# Patient Record
Sex: Female | Born: 1950 | Race: White | Hispanic: No | Marital: Married | Smoking: Never smoker
Health system: Southern US, Community
[De-identification: ages and names within clinical notes are randomized; demographics above are authoritative.]

## PROBLEM LIST (undated history)

## (undated) DIAGNOSIS — I10 Essential (primary) hypertension: Secondary | ICD-10-CM

## (undated) DIAGNOSIS — F419 Anxiety disorder, unspecified: Secondary | ICD-10-CM

## (undated) DIAGNOSIS — N8501 Benign endometrial hyperplasia: Secondary | ICD-10-CM

## (undated) DIAGNOSIS — C50919 Malignant neoplasm of unspecified site of unspecified female breast: Secondary | ICD-10-CM

## (undated) DIAGNOSIS — E119 Type 2 diabetes mellitus without complications: Secondary | ICD-10-CM

## (undated) DIAGNOSIS — G4730 Sleep apnea, unspecified: Secondary | ICD-10-CM

## (undated) DIAGNOSIS — Z923 Personal history of irradiation: Secondary | ICD-10-CM

## (undated) HISTORY — DX: Type 2 diabetes mellitus without complications: E11.9

## (undated) HISTORY — DX: Anxiety disorder, unspecified: F41.9

## (undated) HISTORY — DX: Malignant neoplasm of unspecified site of unspecified female breast: C50.919

## (undated) HISTORY — DX: Benign endometrial hyperplasia: N85.01

## (undated) HISTORY — PX: COLONOSCOPY: SHX174

---

## 2011-10-16 ENCOUNTER — Other Ambulatory Visit: Payer: Self-pay | Admitting: Obstetrics and Gynecology

## 2011-10-16 DIAGNOSIS — N6002 Solitary cyst of left breast: Secondary | ICD-10-CM

## 2011-10-27 ENCOUNTER — Other Ambulatory Visit: Payer: Self-pay

## 2011-10-30 ENCOUNTER — Other Ambulatory Visit: Payer: Self-pay | Admitting: Obstetrics and Gynecology

## 2011-10-30 ENCOUNTER — Ambulatory Visit
Admission: RE | Admit: 2011-10-30 | Discharge: 2011-10-30 | Disposition: A | Discharge status: Home / Self Care | Payer: BC Managed Care – PPO | Source: Ambulatory Visit | Attending: Obstetrics and Gynecology | Admitting: Obstetrics and Gynecology

## 2011-10-30 ENCOUNTER — Other Ambulatory Visit: Payer: Self-pay

## 2011-10-30 DIAGNOSIS — N6002 Solitary cyst of left breast: Secondary | ICD-10-CM

## 2012-10-04 ENCOUNTER — Other Ambulatory Visit: Payer: Self-pay | Admitting: Obstetrics and Gynecology

## 2012-10-05 ENCOUNTER — Other Ambulatory Visit: Payer: Self-pay | Admitting: *Deleted

## 2012-10-05 DIAGNOSIS — N644 Mastodynia: Secondary | ICD-10-CM

## 2012-10-13 HISTORY — PX: BREAST LUMPECTOMY: SHX2

## 2012-11-01 ENCOUNTER — Other Ambulatory Visit: Payer: Self-pay | Admitting: *Deleted

## 2012-11-01 ENCOUNTER — Ambulatory Visit
Admission: RE | Admit: 2012-11-01 | Discharge: 2012-11-01 | Disposition: A | Discharge status: Home / Self Care | Payer: BC Managed Care – PPO | Source: Ambulatory Visit | Attending: *Deleted | Admitting: *Deleted

## 2012-11-01 DIAGNOSIS — N644 Mastodynia: Secondary | ICD-10-CM

## 2012-11-04 ENCOUNTER — Telehealth: Payer: Self-pay | Admitting: *Deleted

## 2012-11-04 DIAGNOSIS — C50819 Malignant neoplasm of overlapping sites of unspecified female breast: Secondary | ICD-10-CM

## 2012-11-04 NOTE — Telephone Encounter (Signed)
Confirmed [facility] for 11/10/12 at 1200.  Instructions and contact information given.

## 2012-11-10 ENCOUNTER — Ambulatory Visit: Payer: BC Managed Care – PPO | Admitting: Physical Therapy

## 2012-11-10 ENCOUNTER — Ambulatory Visit: Payer: BC Managed Care – PPO | Admitting: Oncology

## 2012-11-10 ENCOUNTER — Other Ambulatory Visit: Payer: BC Managed Care – PPO | Admitting: Lab

## 2012-11-10 ENCOUNTER — Ambulatory Visit (INDEPENDENT_AMBULATORY_CARE_PROVIDER_SITE_OTHER): Payer: BC Managed Care – PPO | Admitting: General Surgery

## 2012-11-10 ENCOUNTER — Ambulatory Visit: Payer: BC Managed Care – PPO

## 2012-11-10 ENCOUNTER — Ambulatory Visit
Admission: RE | Admit: 2012-11-10 | Payer: BC Managed Care – PPO | Source: Ambulatory Visit | Admitting: Radiation Oncology

## 2012-11-12 ENCOUNTER — Telehealth: Payer: Self-pay | Admitting: *Deleted

## 2012-11-12 NOTE — Telephone Encounter (Signed)
Pt called stating she was unable to make the appt d/t the weather.  We have r/s her appt for [facility] on 11/17/12 for PM clinic to start at 1200.  Baltazar Apo confirmed new date and time.

## 2012-11-13 HISTORY — PX: BREAST SURGERY: SHX581

## 2012-11-17 ENCOUNTER — Encounter: Payer: Self-pay | Admitting: Oncology

## 2012-11-17 ENCOUNTER — Ambulatory Visit
Admission: RE | Admit: 2012-11-17 | Discharge: 2012-11-17 | Disposition: A | Discharge status: Home / Self Care | Payer: BC Managed Care – PPO | Source: Ambulatory Visit | Attending: Radiation Oncology | Admitting: Radiation Oncology

## 2012-11-17 ENCOUNTER — Encounter (INDEPENDENT_AMBULATORY_CARE_PROVIDER_SITE_OTHER): Payer: Self-pay | Admitting: Surgery

## 2012-11-17 ENCOUNTER — Other Ambulatory Visit (HOSPITAL_BASED_OUTPATIENT_CLINIC_OR_DEPARTMENT_OTHER): Payer: BC Managed Care – PPO | Admitting: Lab

## 2012-11-17 ENCOUNTER — Ambulatory Visit (HOSPITAL_BASED_OUTPATIENT_CLINIC_OR_DEPARTMENT_OTHER): Payer: BC Managed Care – PPO | Admitting: Oncology

## 2012-11-17 ENCOUNTER — Encounter: Payer: Self-pay | Admitting: *Deleted

## 2012-11-17 ENCOUNTER — Ambulatory Visit: Payer: BC Managed Care – PPO

## 2012-11-17 ENCOUNTER — Ambulatory Visit: Payer: BC Managed Care – PPO | Attending: Surgery | Admitting: Physical Therapy

## 2012-11-17 ENCOUNTER — Ambulatory Visit (HOSPITAL_BASED_OUTPATIENT_CLINIC_OR_DEPARTMENT_OTHER): Payer: BC Managed Care – PPO | Admitting: Surgery

## 2012-11-17 ENCOUNTER — Telehealth: Payer: Self-pay | Admitting: Oncology

## 2012-11-17 VITALS — BP 149/90 | HR 116 | Temp 97.9°F | Resp 20 | Ht 64.5 in | Wt 232.5 lb

## 2012-11-17 DIAGNOSIS — C50819 Malignant neoplasm of overlapping sites of unspecified female breast: Secondary | ICD-10-CM

## 2012-11-17 DIAGNOSIS — C50919 Malignant neoplasm of unspecified site of unspecified female breast: Secondary | ICD-10-CM

## 2012-11-17 DIAGNOSIS — R293 Abnormal posture: Secondary | ICD-10-CM | POA: Insufficient documentation

## 2012-11-17 DIAGNOSIS — Z170 Estrogen receptor positive status [ER+]: Secondary | ICD-10-CM

## 2012-11-17 DIAGNOSIS — M25619 Stiffness of unspecified shoulder, not elsewhere classified: Secondary | ICD-10-CM | POA: Insufficient documentation

## 2012-11-17 DIAGNOSIS — IMO0001 Reserved for inherently not codable concepts without codable children: Secondary | ICD-10-CM | POA: Insufficient documentation

## 2012-11-17 LAB — COMPREHENSIVE METABOLIC PANEL (CC13)
ALT: 40 U/L (ref 0–55)
AST: 32 U/L (ref 5–34)
Alkaline Phosphatase: 74 U/L (ref 40–150)
BUN: 10.4 mg/dL (ref 7.0–26.0)
CO2: 22 mEq/L (ref 22–29)
Calcium: 10 mg/dL (ref 8.4–10.4)
Glucose: 392 mg/dl — ABNORMAL HIGH (ref 70–99)
Potassium: 4 mEq/L (ref 3.5–5.1)
Sodium: 135 mEq/L — ABNORMAL LOW (ref 136–145)
Total Bilirubin: 0.91 mg/dL (ref 0.20–1.20)

## 2012-11-17 LAB — CBC WITH DIFFERENTIAL/PLATELET
Basophils Absolute: 0 10*3/uL (ref 0.0–0.1)
Eosinophils Absolute: 0 10*3/uL (ref 0.0–0.5)
HCT: 44.9 % (ref 34.8–46.6)
HGB: 15.4 g/dL (ref 11.6–15.9)
LYMPH%: 22.9 % (ref 14.0–49.7)
MCV: 94.8 fL (ref 79.5–101.0)
MONO#: 0.6 10*3/uL (ref 0.1–0.9)
MONO%: 7.5 % (ref 0.0–14.0)
NEUT#: 5.7 10*3/uL (ref 1.5–6.5)
Platelets: 248 10*3/uL (ref 145–400)
WBC: 8.3 10*3/uL (ref 3.9–10.3)

## 2012-11-17 NOTE — Progress Notes (Signed)
Checked in new patient. No financial issues. I gave her a [facility] packet. She said she didn't receive one. She was a little nervous- I walked with her out to lobby.

## 2012-11-17 NOTE — Progress Notes (Signed)
[facility] Radiation Oncology NEW PATIENT EVALUATION  Name: Linda Boyer MRN: [id number]  Date:   11/17/2012           DOB: 01-Sep-1951  Status: outpatient   CC:   Shelly Rubenstein, MD , Dr. Samara Deist Greven/Dr. Daleen Squibb, [facility] Department of Rad Onc [phone] # [phone])   REFERRING PHYSICIAN: Shelly Rubenstein, MD   DIAGNOSIS: Stage I (T1, N0, M0) invasive ductal carcinoma of the right breast   HISTORY OF PRESENT ILLNESS:  Linda Boyer is a 62 y.o. female who is today at the [facility]  for the courtesy of Dr. Magnus Ivan for discussion of possible radiation therapy following conservative surgery in the management of her T1 N0 invasive ductal carcinoma of the right breast. At the time of screening mammography at the [facility] she was noted to have a new spiculated mass within the upper right breast, anteriorly third. There were a few associated punctate calcifications. On examination there was a focal area of thickening at the 12:00 position of the right breast, 3 cm from the nipple. On ultrasound she was found have a 1.0 x 1.0 x 1.2 cm hypoechoic mass at 12:00,  3 cm from the nipple. There were no abnormal appearing right axillary lymph nodes. Ultrasound-guided biopsy on 11/01/2012 was diagnostic for invasive ductal carcinoma. This was ER positive at 86% and PR positive at 100% with a Ki-67 proliferation marker of 16%. HER-2/neu was not amplified. Breast MRI was not performed. She is seen today with Dr. Abigail Miyamoto of general surgery and Dr. Dr. Darnelle Catalan of medical oncology.  PREVIOUS RADIATION THERAPY: No   PAST MEDICAL HISTORY:  has a past medical history of Breast cancer and Diabetes mellitus without complication.     PAST SURGICAL HISTORY: No past surgical history on file.   FAMILY HISTORY: Her father died of a heart attack at 41. Her mother died from kidney failure at 71. No known family history of breast cancer.  SOCIAL HISTORY:  reports that she has never smoked.  She does not have any smokeless tobacco history on file. She reports that she does not drink alcohol or use illicit drugs. Married, one child. 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   ALLERGIES: Review of patient's allergies indicates no known allergies.   MEDICATIONS:  Current Outpatient Prescriptions  Medication Sig Dispense Refill  . fish oil-omega-3 fatty acids 1000 MG capsule Take 2 g by mouth daily.      Marland Kitchen glipiZIDE (GLUCOTROL) 10 MG tablet Take 20 mg by mouth 2 (two) times daily before a meal.      . lisinopril (PRINIVIL,ZESTRIL) 20 MG tablet Take 20 mg by mouth daily.      . metFORMIN (GLUCOPHAGE) 500 MG tablet Take 1,000 mg by mouth 2 (two) times daily with a meal.      . Multiple Vitamins-Minerals (MULTIVITAMIN PO) Take by mouth daily.      Marland Kitchen VITAMIN E PO Take by mouth.         REVIEW OF SYSTEMS:  Pertinent items are noted in HPI.    PHYSICAL EXAM: Alert and oriented 62 year old white female appearing her stated age. Wt Readings from Last 3 Encounters:  11/17/12 232 lb 8 oz (105.461 kg)   Temp Readings from Last 3 Encounters:  11/17/12 97.9 F (36.6 C)    BP Readings from Last 3 Encounters:  11/17/12 149/90   Pulse Readings from Last  3 Encounters:  11/17/12 116   Head and neck examination: Grossly unremarkable. Nodes: Without palpable cervical, supraclavicular, or axillary lymphadenopathy. Chest: Lungs clear. Back: Without spinal or CVA tenderness. Heart: Regular in rhythm. Breasts: There is a palpable 2-3 cm hematoma at approximately 11:00 adjacent to the nipple areolar complex. There is surrounding ecchymosis. No other masses are appreciated. Left breast without masses or lesions. Abdomen: Without masses organomegaly. Extremities: Without edema.    LABORATORY DATA:  Lab Results  Component Value Date   WBC 8.3 11/17/2012   HGB 15.4 11/17/2012   HCT 44.9 11/17/2012   MCV 94.8 11/17/2012   PLT 248  11/17/2012   Lab Results  Component Value Date   NA 135* 11/17/2012   K 4.0 11/17/2012   CL 96* 11/17/2012   CO2 22 11/17/2012   Lab Results  Component Value Date   ALT 40 11/17/2012   AST 32 11/17/2012   ALKPHOS 74 11/17/2012   BILITOT 0.91 11/17/2012      IMPRESSION: Clinical stage I (T1, N0, M0) invasive ductal carcinoma of the right breast. Local treatment options include mastectomy versus partial mastectomy followed by radiation therapy. She would also need a sentinel lymph node biopsy. She desires breast preservation. Based on her breast size she is probably not a candidate for hypo-fractionated radiation therapy. She may be a candidate for prone breast radiation. We discussed the potential acute and late toxicities of radiation therapy. [place] in [place]. [place], and I think it would be more convenient for her to be treated at [facility] in  [place] . We can make a referral to Dr. Luana Shu or Dr. Daleen Squibb to see her following her definitive surgery. I understand that Dr. Darnelle Catalan will order Oncotype DX testing to see she would benefit from chemotherapy in addition to adjuvant hormone therapy.   PLAN: As discussed above. She will be represented at the Wednesday morning breast conference. A referral can be made at that time to Dr. Gwendalyn Ege or Dr. Carolin Sicks.   I spent 40 minutes minutes face to face with the patient and more than 50% of that time was spent in counseling and/or coordination of care.

## 2012-11-17 NOTE — Progress Notes (Signed)
Patient ID: Linda Boyer, female   DOB: Nov 17, 1950, 61 y.o.   MRN: [id number]  Chief Complaint  Patient presents with  . Follow-up    right breast cancer    HPI Linda Boyer is a 62 y.o. female.   HPI She is referred by Dr. Ephriam Knuckles in [place]. [place] After recent screening mammography demonstrated a mass in the right breast. She denies nipple discharge. She has had no previous problems with her breasts. She has had no previous surgery on her breast. Family history is negative for breast cancer. She is otherwise without complaints. Past Medical History  Diagnosis Date  . Breast cancer   . Diabetes mellitus without complication     History reviewed. No pertinent past surgical history.  History reviewed. No pertinent family history.  Social History History  Substance Use Topics  . Smoking status: Never Smoker   . Smokeless tobacco: Not on file  . Alcohol Use: No    No Known Allergies  Current Outpatient Prescriptions  Medication Sig Dispense Refill  . fish oil-omega-3 fatty acids 1000 MG capsule Take 2 g by mouth daily.      Marland Kitchen glipiZIDE (GLUCOTROL) 10 MG tablet Take 20 mg by mouth 2 (two) times daily before a meal.      . lisinopril (PRINIVIL,ZESTRIL) 20 MG tablet Take 20 mg by mouth daily.      . metFORMIN (GLUCOPHAGE) 500 MG tablet Take 1,000 mg by mouth 2 (two) times daily with a meal.      . Multiple Vitamins-Minerals (MULTIVITAMIN PO) Take by mouth daily.      Marland Kitchen VITAMIN E PO Take by mouth.        Review of Systems Review of Systems  Constitutional: Negative for fever, chills and unexpected weight change.  HENT: Negative for hearing loss, congestion, sore throat, trouble swallowing and voice change.   Eyes: Negative for visual disturbance.  Respiratory: Negative for cough and wheezing.   Cardiovascular: Negative for chest pain, palpitations and leg swelling.  Gastrointestinal: Negative for nausea, vomiting, abdominal pain, diarrhea, constipation, blood in stool,  abdominal distention and anal bleeding.  Genitourinary: Negative for hematuria, vaginal bleeding and difficulty urinating.  Musculoskeletal: Negative for arthralgias.  Skin: Negative for rash and wound.  Neurological: Negative for seizures, syncope and headaches.  Hematological: Negative for adenopathy. Does not bruise/bleed easily.  Psychiatric/Behavioral: Negative for confusion.    There were no vitals taken for this visit.  Physical Exam Physical Exam  Constitutional: She is oriented to person, place, and time. She appears well-developed and well-nourished. No distress.       obese  HENT:  Head: Normocephalic and atraumatic.  Right Ear: External ear normal.  Left Ear: External ear normal.  Nose: Nose normal.  Mouth/Throat: Oropharynx is clear and moist. No oropharyngeal exudate.  Eyes: Conjunctivae normal are normal. Pupils are equal, round, and reactive to light. Right eye exhibits no discharge. Left eye exhibits no discharge. No scleral icterus.  Neck: Normal range of motion. Neck supple. No tracheal deviation present. No thyromegaly present.  Cardiovascular: Normal rate, regular rhythm, normal heart sounds and intact distal pulses.   No murmur heard. Pulmonary/Chest: Effort normal and breath sounds normal. No respiratory distress. She has no wheezes. She has no rales.  Abdominal: Soft. Bowel sounds are normal. She exhibits no distension. There is no tenderness. There is no rebound.  Musculoskeletal: Normal range of motion. She exhibits no edema and no tenderness.  Lymphadenopathy:    She has no cervical adenopathy.  Neurological: She is alert and oriented to person, place, and time.  Skin: Skin is warm and dry. No rash noted. She is not diaphoretic. No erythema.  Psychiatric: Her behavior is normal. Judgment normal.  Breast:  Moderate amount of hematoma and ecchymosis in right breast.  No other palpable masses +enlarged right axillary lymph node  Data Reviewed The  ultrasound, mammogram, pathology results have been reviewed  Assessment    Invasive right breast cancer    Plan    After discussion in the multidisciplinary conference, lumpectomy and sentinel of the biopsy has been recommended. I discussed breast conservation versus mastectomy with her and she would like to proceed with breast conservation. I discussed the surgical procedure in detail. I discussed the risks of surgery which include but not limited to bleeding, infection, need for further surgery should the margins were noted to be positive, injury to surrounding structures, et Karie Soda. She understands and wishes to proceed. Likelihood of success is good.       Iliana Hutt A 11/17/2012, 2:01 PM

## 2012-11-17 NOTE — Progress Notes (Signed)
ID: Roderick Pee   DOB: [birth year]  MR#: [id number]  WUJ#:[id number]  PCP: Ephriam Knuckles GYN:  SU: Abigail Miyamoto OTHER MD: Chipper Herb   HISTORY OF PRESENT ILLNESS: Ms. Timpone originally presented with discomfort in the lateral aspect of the left breast. She was referred for mammography to the [facility] Center 10/30/2011, and this showed dense breast tissue bilaterally but no suspicious finding in the left breast. There was no palpable abnormality. Ultrasound of the left breast was negative   On repeat diagnostic mammography a year later however, 11/01/2012, a spiculated mass was found in the upper right breast. There were a few associated punctate calcifications. An area of focal thickening was palpable and ultrasound confirmed a 1.2 cm irregular hypoechoic mass at the 12:00 position of the right breast 3 cm from the nipple. There was no abnormal adenopathy. The rest of the right breast and the left breast were unremarkable.  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 The MIB-1-1 was 16%. There was no HER-2 amplification.  The patient's subsequent history is as detailed below  INTERVAL HISTORY: Ms. Caban is seen in the multidisciplinary breast cancer clinic 11/17/2012. Her case was discussed the morning conference that same day.  REVIEW OF SYSTEMS: She still has some discomfort associated with her left premises not right) breast. She has a sore in her left pinna she wanted me to look at. She has to "bumpy places" 1 in the right lower quadrant of her abdomen and the other one in the deltoid area of her right arm, which she tells me have been present for years and have not changed. Otherwise a detailed review of systems today was noncontributory. Note that she does not exercise regularly.   PAST MEDICAL HISTORY: Past Medical History  Diagnosis Date  . Breast cancer    . Diabetes mellitus without complication    hypertension  PAST SURGICAL HISTORY: No past surgical history on file.  FAMILY HISTORY No family history on file. The patient's father died from a myocardial infarction at age 18. The patient's mother died from renal failure at age 7. The patient had 2 half brothers. Both died from lung cancer in the setting of tobacco abuse. The patient has 4 sisters. There is no history of breast or ovarian cancer in the family.  GYNECOLOGIC HISTORY: Menarche age 26, first live birth age 12, she is GX P1. Menopause 2009. She never took hormone replacement.  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 Her husband Rosanne Ashing is a Naval architect, and she makes called to arrange for his loads. Their son Barbara Cower lives in [place]. area where he works as a Curator. The patient has 1 grandchild. She is not a Advice worker.  ADVANCED DIRECTIVES: Not in place  HEALTH MAINTENANCE: History  Substance Use Topics  . Smoking status: Never Smoker   . Smokeless tobacco: Not on file  . Alcohol Use: No     Colonoscopy: 2013  PAP: 2012  Bone density: 2011  Lipid panel:  No Known Allergies  Current Outpatient Prescriptions  Medication Sig Dispense Refill  . fish oil-omega-3 fatty acids 1000 MG capsule Take 2 g by mouth daily.      Marland Kitchen glipiZIDE (GLUCOTROL) 10 MG tablet Take 20 mg by mouth 2 (two) times daily before a meal.      . lisinopril (PRINIVIL,ZESTRIL) 20 MG  tablet Take 20 mg by mouth daily.      . metFORMIN (GLUCOPHAGE) 500 MG tablet Take 1,000 mg by mouth 2 (two) times daily with a meal.      . Multiple Vitamins-Minerals (MULTIVITAMIN PO) Take by mouth daily.      Marland Kitchen VITAMIN E PO Take by mouth.        OBJECTIVE: Middle-aged white woman who appears anxious Filed Vitals:   11/17/12 1236  BP: 149/90  Pulse: 116  Temp: 97.9 F (36.6 C)  Resp: 20     Body mass index is 39.29 kg/(m^2).    ECOG  FS:  Sclerae unicteric Oropharynx clear No cervical or supraclavicular adenopathy Lungs no rales or rhonchi Heart regular rate and rhythm Abd benign MSK mild kyphosis and scoliosis but no focal spinal tenderness Neuro: nonfocal, well oriented, appropriate affect Breasts: The right breast is status post recent biopsy. There is significant ecchymosis and the palpable lump I am feeling superiorly and laterally is likely to be seroma or hematoma and not tumor. There is no nipple inversion. The right axilla is benign. The left breast is unremarkable Skin: She has a small sore in her left pinna it is approximately 0.5 cm x 0.25 cm, with a scab in place. This appears to be a healing sore. She has a slight bump in her right lower quadrant, approximately 2 inches from the umbilicus, which is likely to be a 1 cm lipoma. She has a similar lipoma on the underside of her right upper extremity  LAB RESULTS: Lab Results  Component Value Date   WBC 8.3 11/17/2012   NEUTROABS 5.7 11/17/2012   HGB 15.4 11/17/2012   HCT 44.9 11/17/2012   MCV 94.8 11/17/2012   PLT 248 11/17/2012      Chemistry      Component Value Date/Time   NA 135* 11/17/2012 1223   K 4.0 11/17/2012 1223   CL 96* 11/17/2012 1223   CO2 22 11/17/2012 1223   BUN 10.4 11/17/2012 1223   CREATININE 1.1 11/17/2012 1223      Component Value Date/Time   CALCIUM 10.0 11/17/2012 1223   ALKPHOS 74 11/17/2012 1223   AST 32 11/17/2012 1223   ALT 40 11/17/2012 1223   BILITOT 0.91 11/17/2012 1223       No results found for this basename: LABCA2    No components found with this basename: LABCA125    No results found for this basename: INR:1;PROTIME:1 in the last 168 hours  Urinalysis No results found for this basename: colorurine, appearanceur, labspec, phurine, glucoseu, hgbur, bilirubinur, ketonesur, proteinur, urobilinogen, nitrite, leukocytesur    STUDIES: US Breast Bilateral  11/01/2012  *RADIOLOGY REPORT*  Clinical Data:  62 year old female for annual  bilateral mammograms with focal tenderness in the outer left breast.  DIGITAL DIAGNOSTIC BILATERAL MAMMOGRAM WITH CAD AND BILATERAL BREAST ULTRASOUND:  Comparison:  Left mammograms dated 10/31/2011 [facility] and left mammograms dated 08/29/2011 from [facility] in [place].  Findings:  ACR Breast Density Category 2: There is a scattered fibroglandular pattern.  A spiculated mass within the upper right breast, anterior third, is identified with a few associated punctate calcifications. There is no evidence of suspicious mass, distortion or worrisome calcifications within the left breast.  Mammographic images were processed with CAD.  On physical exam, a focal area of thickening is identified at the 12 o'clock position of the right breast 3 cm from the nipple. No palpable abnormalities are identified within the entire outer left  breast.  Ultrasound is performed, showing a 1 x 1 x 1.2 cm irregular hypoechoic mass with shadowing at the 12 o'clock position of the right breast 3 cm from the nipple.  No enlarged or abnormal- appearing right axillary lymph nodes are identified. There is no evidence of solid or cystic mass, distortion, or abnormal areas of shadowing within the outer left breast, in the area of patient's tenderness.  IMPRESSION: 1 x 1 x 1.2 cm irregular mass in the upper right breast - highly suspicious for neoplasm.  No enlarged or abnormal appearing right axillary lymph nodes identified.  No mammographic, palpable or sonographic abnormality within the outer left breast, in the area of the patient's pain.  BI-RADS CATEGORY 5:  Highly suggestive of malignancy - appropriate action should be taken.  RECOMMENDATION: Tissue sampling of the right breast mass.  This was discussed with the patient and she desires ultrasound-guided right breast biopsy, which will be performed today but dictated in a separate report.  I discussed these findings with Dr. Sibyl Parr on 11/01/2012.  I discussed the  findings and recommendations with the patient and her questions answered. A written report was given to the patient.   Original Report Authenticated By: Harmon Pier, M.D.    Mm Digital Diagnostic Bilat  11/01/2012  *RADIOLOGY REPORT*  Clinical Data:  62 year old female for annual bilateral mammograms with focal tenderness in the outer left breast.  DIGITAL DIAGNOSTIC BILATERAL MAMMOGRAM WITH CAD AND BILATERAL BREAST ULTRASOUND:  Comparison:  Left mammograms dated 10/31/2011 from [facility] and left mammograms dated 08/29/2011 from [facility] in [place].  Findings:  ACR Breast Density Category 2: There is a scattered fibroglandular pattern.  A spiculated mass within the upper right breast, anterior third, is identified with a few associated punctate calcifications. There is no evidence of suspicious mass, distortion or worrisome calcifications within the left breast.  Mammographic images were processed with CAD.  On physical exam, a focal area of thickening is identified at the 12 o'clock position of the right breast 3 cm from the nipple. No palpable abnormalities are identified within the entire outer left breast.  Ultrasound is performed, showing a 1 x 1 x 1.2 cm irregular hypoechoic mass with shadowing at the 12 o'clock position of the right breast 3 cm from the nipple.  No enlarged or abnormal- appearing right axillary lymph nodes are identified. There is no evidence of solid or cystic mass, distortion, or abnormal areas of shadowing within the outer left breast, in the area of patient's tenderness.  IMPRESSION: 1 x 1 x 1.2 cm irregular mass in the upper right breast - highly suspicious for neoplasm.  No enlarged or abnormal appearing right axillary lymph nodes identified.  No mammographic, palpable or sonographic abnormality within the outer left breast, in the area of the patient's pain.  BI-RADS CATEGORY 5:  Highly suggestive of malignancy - appropriate action should be taken.   RECOMMENDATION: Tissue sampling of the right breast mass.  This was discussed with the patient and she desires ultrasound-guided right breast biopsy, which will be performed today but dictated in a separate report.  I discussed these findings with Dr. Sibyl Parr on 11/01/2012.  I discussed the findings and recommendations with the patient and her questions answered. A written report was given to the patient.   Original Report Authenticated By: Harmon Pier, M.D.    US Breast Bx W Loc Dev 1st Lesion Img Bx Spec US Guide  11/02/2012  **ADDENDUM** CREATED: 11/02/2012 15:43:03  The  pathology associated with the right breast ultrasound guided core biopsy demonstrated grade 2 invasive ductal carcinoma.  This is concordant with imaging findings.  I discussed the findings with the patient by telephone and answered her questions.  The patient states that her biopsy site is clean and dry without significant hematoma formation or tenderness.  Post biopsy wound care instructions were reviewed with the patient.  Following discussion with the patient, she would like to be seen at the breast cancer multidisciplinary clinic at the [facility].  This will be arranged for 11/10/2012.  The patient was encouraged to call the Breast Center for additional questions or concerns.  **END ADDENDUM** SIGNED BY: Rolla Plate, M.D.   11/02/2012  *RADIOLOGY REPORT*  Clinical Data:  62 year old female with suspicious right breast mass - for tissue sampling.  ULTRASOUND GUIDED VACUUM ASSISTED CORE BIOPSY OF THE RIGHT BREAST  Comparison: Previous exams.  I met with the patient and we discussed the procedure of ultrasound- guided biopsy, including benefits and alternatives.  We discussed the high likelihood of a successful procedure. We discussed the risks of the procedure including infection, bleeding, tissue injury, clip migration, and inadequate sampling.  Informed written consent was given.  Using sterile technique, 2% lidocaine  ultrasound guidance and a 12 gauge vacuum assisted needle biopsy was performed of the 1 x 1.2 cm irregular hypoechoic mass at the 12 o'clock position of the right breast 3 cm from the nipple using a lateral approach.  At the conclusion of the procedure, a ribbon shaped tissue marker clip was deployed into the biopsy cavity.  Post biopsy mammogram demonstrates the ribbon shaped biopsy clip in satisfactory position. Mild to moderate postbiopsy hemorrhage is noted.  IMPRESSION: Ultrasound-guided biopsy of right breast mass with satisfactory clip placement. Mild to moderate postbiopsy hemorrhage.  The patient was given detailed care instructions and encouraged to contact me if she had any problems with the biopsy site.  Pathology will be followed, and the patient will be contacted to give her these results and to check on her biopsy site.   Original Report Authenticated By: Harmon Pier, M.D.     ASSESSMENT: 62 y.o. [place], [place] woman status post right breast biopsy 11/01/2012 for a clinical T1c N0, stage IA invasive ductal carcinoma, grade 2, estrogen receptor 86% and progesterone receptor 100% positive, with an MIB-1 of 16% and no HER-2 amplification   PLAN: We spent the better part of her hour-long visit today discussing breast cancer in general, the treatment options, and her home situation. She understands her prognosis in general is good. My feeling is she will not likely benefit from chemotherapy, but we will send an Oncotype DX from her definitive surgery to help [place] make that decision. We did discuss the difference between lumpectomy and mastectomy, and also the need for radiation after lumpectomy. She understands that whether or not she receives chemotherapy, she will be on anti-estrogens for 5 years of once she completes her local treatment.  I believe she was quite reassured after our talk, and understands I do not feel this tumor will eventually take her life, and that she has a very good chance of cure.  She will see me approximately 3 weeks postop, by which time we will have the Oncotype results, and we will make a definitive decision regarding chemotherapy at that time.   MAGRINAT,GUSTAV C    11/17/2012

## 2012-11-17 NOTE — Telephone Encounter (Signed)
gv pt appt schedule for March. °

## 2012-11-20 ENCOUNTER — Encounter: Payer: Self-pay | Admitting: *Deleted

## 2012-11-20 NOTE — Progress Notes (Signed)
Mailed after appt letter to pt. 

## 2012-11-22 ENCOUNTER — Telehealth: Payer: Self-pay | Admitting: *Deleted

## 2012-11-22 ENCOUNTER — Telehealth: Payer: Self-pay | Admitting: Oncology

## 2012-11-22 ENCOUNTER — Other Ambulatory Visit: Payer: Self-pay | Admitting: Oncology

## 2012-11-22 ENCOUNTER — Encounter: Payer: Self-pay | Admitting: *Deleted

## 2012-11-22 NOTE — Telephone Encounter (Signed)
Faxed pt medical records to [employer] 

## 2012-11-22 NOTE — Telephone Encounter (Signed)
Gave pt appt date and time of 11/29/12 at 1:45 for Rad Onc at [place].

## 2012-11-22 NOTE — Telephone Encounter (Signed)
Spoke to pt concerning [facility] from 11/17/12.  Pt denies questions or concerns regarding dx or treatment care plan.  Encourage pt to call with needs.  Received verbal understanding.  Confirmed surgery date.  Informed pt that referral to [facility] for radiation would be made.  Contact information given.

## 2012-11-25 ENCOUNTER — Telehealth: Payer: Self-pay | Admitting: Oncology

## 2012-11-25 NOTE — Telephone Encounter (Signed)
S/w the pt and she is aware of her r/s 12/31/2012 appt to 01/07/2013 due to a change in the md's schedule.

## 2012-12-01 ENCOUNTER — Encounter (HOSPITAL_COMMUNITY): Payer: Self-pay

## 2012-12-03 ENCOUNTER — Ambulatory Visit (HOSPITAL_COMMUNITY)
Admission: RE | Admit: 2012-12-03 | Discharge: 2012-12-03 | Disposition: A | Discharge status: Home / Self Care | Payer: BC Managed Care – PPO | Source: Ambulatory Visit | Attending: Anesthesiology | Admitting: Anesthesiology

## 2012-12-03 ENCOUNTER — Encounter (HOSPITAL_COMMUNITY): Payer: Self-pay

## 2012-12-03 ENCOUNTER — Encounter (HOSPITAL_COMMUNITY)
Admission: RE | Admit: 2012-12-03 | Discharge: 2012-12-03 | Disposition: A | Discharge status: Home / Self Care | Payer: BC Managed Care – PPO | Source: Ambulatory Visit | Attending: Surgery | Admitting: Surgery

## 2012-12-03 VITALS — BP 110/77 | HR 114 | Temp 98.7°F | Resp 20 | Ht 65.0 in | Wt 230.8 lb

## 2012-12-03 DIAGNOSIS — C50919 Malignant neoplasm of unspecified site of unspecified female breast: Secondary | ICD-10-CM

## 2012-12-03 DIAGNOSIS — I4510 Unspecified right bundle-branch block: Secondary | ICD-10-CM | POA: Insufficient documentation

## 2012-12-03 DIAGNOSIS — Z01812 Encounter for preprocedural laboratory examination: Secondary | ICD-10-CM | POA: Insufficient documentation

## 2012-12-03 DIAGNOSIS — Z01818 Encounter for other preprocedural examination: Secondary | ICD-10-CM | POA: Insufficient documentation

## 2012-12-03 DIAGNOSIS — R9431 Abnormal electrocardiogram [ECG] [EKG]: Secondary | ICD-10-CM | POA: Insufficient documentation

## 2012-12-03 DIAGNOSIS — R928 Other abnormal and inconclusive findings on diagnostic imaging of breast: Secondary | ICD-10-CM | POA: Insufficient documentation

## 2012-12-03 DIAGNOSIS — Z01810 Encounter for preprocedural cardiovascular examination: Secondary | ICD-10-CM | POA: Insufficient documentation

## 2012-12-03 HISTORY — DX: Essential (primary) hypertension: I10

## 2012-12-03 HISTORY — DX: Sleep apnea, unspecified: G47.30

## 2012-12-03 LAB — CBC
HCT: 45.4 % (ref 36.0–46.0)
Platelets: 168 10*3/uL (ref 150–400)
RDW: 12.4 % (ref 11.5–15.5)
WBC: 7.9 10*3/uL (ref 4.0–10.5)

## 2012-12-03 LAB — BASIC METABOLIC PANEL
Calcium: 10.8 mg/dL — ABNORMAL HIGH (ref 8.4–10.5)
Chloride: 93 mEq/L — ABNORMAL LOW (ref 96–112)
Creatinine, Ser: 0.65 mg/dL (ref 0.50–1.10)
GFR calc Af Amer: >90 mL/min (ref >90)

## 2012-12-03 LAB — SURGICAL PCR SCREEN
MRSA, PCR: NEGATIVE
Staphylococcus aureus: NEGATIVE

## 2012-12-03 NOTE — Progress Notes (Signed)
Contacted patients primary's office, Dr. Sibyl Parr, spoke with Talbert Forest, office does not have EKG on patient.

## 2012-12-03 NOTE — Pre-Procedure Instructions (Signed)
DONNAMARIA SHANDS  12/03/2012   Your procedure is scheduled on:  Friday December 10, 2012  Report to [facility] at 7:00 AM.  Call this number if you have problems the morning of surgery: [phone]   Remember:   Do not eat food or drink liquids after midnight.   Take these medicines the morning of surgery with A SIP OF WATER: DISCONTINUE ASPIRIN, COUMADIN, PLAVIX, EFFIENT, AND HERBAL MEDICATIONS   Do not wear jewelry, make-up or nail polish.  Do not wear lotions, powders, or perfumes..  Do not shave 48 hours prior to surgery.  Do not bring valuables to the hospital.  Contacts, dentures or bridgework may not be worn into surgery.  Leave suitcase in the car. After surgery it may be brought to your room.  For patients admitted to the hospital, checkout time is 11:00 AM the day of  discharge.   Patients discharged the day of surgery will not be allowed to drive  home.  Name and phone number of your driver: family / friend  Special Instructions: Shower using CHG 2 nights before surgery and the night before surgery.  If you shower the day of surgery use CHG.  Use special wash - you have one bottle of CHG for all showers.  You should use approximately 1/3 of the bottle for each shower.   Please read over the following fact sheets that you were given: Pain Booklet, Coughing and Deep Breathing, MRSA Information and Surgical Site Infection Prevention

## 2012-12-06 NOTE — Progress Notes (Addendum)
Anesthesia chart review: Patient is a 62 year old female scheduled for needle localized right breast lumpectomy and right sentinel lymph node biopsy for right breast cancer by Dr. Magnus Ivan on 12/10/2012. History includes obesity, nonsmoker, diabetes mellitus type 2, hypertension, obstructive sleep apnea without current CPAP use, breast cancer.  PCP is Dr. Ephriam Knuckles. Oncologist is Dr. Darnelle Catalan.  EKG on 12/03/12 showed NSR, right BBB, LAD.  There are no comparison EKGs in [facility] or at Dr. Esperanza Sheets office. No CV symptoms were documented at her PAT visit and preoperative visit with Dr. Magnus Ivan.  CXR on 12/03/12 showed borderline cardiomegaly, lungs clear, patchy aortic calcifications, spurring in the mid and lower thoracic spine without disc narrowing.  Preoperative labs noted.  Non-fasting glucose was 299. These results were reviewed in Epic by Dr. Magnus Ivan.  She will get a CBG on arrival to re-evaluate her glucose level.    I called to speak with her today to clinically correlate EKG and inquire about glucose control, but it was not a good time for her.  She told me she would call me back at a more convenient time.  Otherwise, she will be evaluated by her assigned anesthesiologist on the day of surgery.  Would anticipate that if she has not had any worrisome CV symptoms and glucose is reasonable then she could proceed as planned.  Shonna Chock, PA-C 12/06/12 1420  Addendum: 12/07/12 1025 I spoke with patient this morning.  She reports fasting glucose is usually ~ 200 (occasionally up to 250).  She denies chest pain, SOB, edema.  She does not really do regular aerobic exercise, but can go up a flight of stairs without difficulty and does so at least 20X/day to access her office desk.  Of note, she did mention that she has been prescribed Ativan (she believes 0.5mg ) TID PRN anxiety and may need to take one on the day of surgery.  I told her she is allowed to take her anxiety medication as prescribed  if she has done well with it in the past.  She realizes that she will need to be coherent/appropriate to talk with her surgeon and anesthesiologist on the day of surgery for consent purposes, that any medication should only be taken with a sip of H20, that she should not drive afterwards, and that she needs to tell staff on arrival which med/dose and time taken.

## 2012-12-09 MED ORDER — CEFAZOLIN SODIUM-DEXTROSE 2-3 GM-% IV SOLR
2.0000 g | INTRAVENOUS | Status: AC
  Administered 2012-12-10: 2 g via INTRAVENOUS
  Filled 2012-12-09: qty 50

## 2012-12-09 NOTE — H&P (Signed)
Patient ID: Linda Boyer, female DOB: 1951/04/02, 62 y.o. MRN: [id number]  Chief Complaint   Patient presents with   .  Follow-up     right breast cancer   HPI  Linda Boyer is a 62 y.o. female.  HPI  She is referred by Dr. Ephriam Knuckles in [place]. [place] After recent screening mammography demonstrated a mass in the right breast. She denies nipple discharge. She has had no previous problems with her breasts. She has had no previous surgery on her breast. Family history is negative for breast cancer. She is otherwise without complaints.  Past Medical History   Diagnosis  Date   .  Breast cancer    .  Diabetes mellitus without complication    History reviewed. No pertinent past surgical history.  History reviewed. No pertinent family history.  Social History  History   Substance Use Topics   .  Smoking status:  Never Smoker   .  Smokeless tobacco:  Not on file   .  Alcohol Use:  No   No Known Allergies  Current Outpatient Prescriptions   Medication  Sig  Dispense  Refill   .  fish oil-omega-3 fatty acids 1000 MG capsule  Take 2 g by mouth daily.     Marland Kitchen  glipiZIDE (GLUCOTROL) 10 MG tablet  Take 20 mg by mouth 2 (two) times daily before a meal.     .  lisinopril (PRINIVIL,ZESTRIL) 20 MG tablet  Take 20 mg by mouth daily.     .  metFORMIN (GLUCOPHAGE) 500 MG tablet  Take 1,000 mg by mouth 2 (two) times daily with a meal.     .  Multiple Vitamins-Minerals (MULTIVITAMIN PO)  Take by mouth daily.     Marland Kitchen  VITAMIN E PO  Take by mouth.     Review of Systems  Review of Systems  Constitutional: Negative for fever, chills and unexpected weight change.  HENT: Negative for hearing loss, congestion, sore throat, trouble swallowing and voice change.  Eyes: Negative for visual disturbance.  Respiratory: Negative for cough and wheezing.  Cardiovascular: Negative for chest pain, palpitations and leg swelling.  Gastrointestinal: Negative for nausea, vomiting, abdominal pain, diarrhea, constipation, blood in  stool, abdominal distention and anal bleeding.  Genitourinary: Negative for hematuria, vaginal bleeding and difficulty urinating.  Musculoskeletal: Negative for arthralgias.  Skin: Negative for rash and wound.  Neurological: Negative for seizures, syncope and headaches.  Hematological: Negative for adenopathy. Does not bruise/bleed easily.  Psychiatric/Behavioral: Negative for confusion.  There were no vitals taken for this visit.  Physical Exam  Physical Exam  Constitutional: She is oriented to person, place, and time. She appears well-developed and well-nourished. No distress.  obese  HENT:  Head: Normocephalic and atraumatic.  Right Ear: External ear normal.  Left Ear: External ear normal.  Nose: Nose normal.  Mouth/Throat: Oropharynx is clear and moist. No oropharyngeal exudate.  Eyes: Conjunctivae normal are normal. Pupils are equal, round, and reactive to light. Right eye exhibits no discharge. Left eye exhibits no discharge. No scleral icterus.  Neck: Normal range of motion. Neck supple. No tracheal deviation present. No thyromegaly present.  Cardiovascular: Normal rate, regular rhythm, normal heart sounds and intact distal pulses.  No murmur heard.  Pulmonary/Chest: Effort normal and breath sounds normal. No respiratory distress. She has no wheezes. She has no rales.  Abdominal: Soft. Bowel sounds are normal. She exhibits no distension. There is no tenderness. There is no rebound.  Musculoskeletal: Normal range of motion. She  exhibits no edema and no tenderness.  Lymphadenopathy:  She has no cervical adenopathy.  Neurological: She is alert and oriented to person, place, and time.  Skin: Skin is warm and dry. No rash noted. She is not diaphoretic. No erythema.  Psychiatric: Her behavior is normal. Judgment normal.  Breast: Moderate amount of hematoma and ecchymosis in right breast. No other palpable masses  +enlarged right axillary lymph node   Data Reviewed  The ultrasound,  mammogram, pathology results have been reviewed   Assessment  Invasive right breast cancer   Plan  After discussion in the multidisciplinary conference, lumpectomy and sentinel of the biopsy has been recommended. I discussed breast conservation versus mastectomy with her and she would like to proceed with breast conservation. I discussed the surgical procedure in detail. I discussed the risks of surgery which include but not limited to bleeding, infection, need for further surgery should the margins were noted to be positive, injury to surrounding structures, et Karie Soda. She understands and wishes to proceed. Likelihood of success is good.

## 2012-12-10 ENCOUNTER — Ambulatory Visit (HOSPITAL_COMMUNITY)
Admission: RE | Admit: 2012-12-10 | Discharge: 2012-12-10 | Disposition: A | Discharge status: Home / Self Care | Payer: BC Managed Care – PPO | Source: Ambulatory Visit | Attending: Surgery | Admitting: Surgery

## 2012-12-10 ENCOUNTER — Ambulatory Visit
Admission: RE | Admit: 2012-12-10 | Discharge: 2012-12-10 | Disposition: A | Discharge status: Home / Self Care | Payer: BC Managed Care – PPO | Source: Ambulatory Visit | Attending: Surgery | Admitting: Surgery

## 2012-12-10 ENCOUNTER — Encounter (HOSPITAL_COMMUNITY): Payer: Self-pay | Admitting: Vascular Surgery

## 2012-12-10 ENCOUNTER — Ambulatory Visit (HOSPITAL_COMMUNITY): Payer: BC Managed Care – PPO | Admitting: Certified Registered Nurse Anesthetist

## 2012-12-10 ENCOUNTER — Other Ambulatory Visit (INDEPENDENT_AMBULATORY_CARE_PROVIDER_SITE_OTHER): Payer: Self-pay | Admitting: Surgery

## 2012-12-10 ENCOUNTER — Encounter (HOSPITAL_COMMUNITY)
Admission: RE | Disposition: A | Discharge status: Home / Self Care | Payer: Self-pay | Source: Ambulatory Visit | Attending: Surgery

## 2012-12-10 ENCOUNTER — Encounter (HOSPITAL_COMMUNITY): Payer: Self-pay | Admitting: *Deleted

## 2012-12-10 DIAGNOSIS — C50919 Malignant neoplasm of unspecified site of unspecified female breast: Secondary | ICD-10-CM

## 2012-12-10 DIAGNOSIS — D0590 Unspecified type of carcinoma in situ of unspecified breast: Secondary | ICD-10-CM

## 2012-12-10 DIAGNOSIS — E119 Type 2 diabetes mellitus without complications: Secondary | ICD-10-CM | POA: Insufficient documentation

## 2012-12-10 HISTORY — PX: BREAST LUMPECTOMY WITH NEEDLE LOCALIZATION AND AXILLARY SENTINEL LYMPH NODE BX: SHX5760

## 2012-12-10 LAB — GLUCOSE, CAPILLARY
Glucose-Capillary: 291 mg/dL — ABNORMAL HIGH (ref 70–99)
Glucose-Capillary: 260 mg/dL — ABNORMAL HIGH (ref 70–99)

## 2012-12-10 SURGERY — BREAST LUMPECTOMY WITH NEEDLE LOCALIZATION AND AXILLARY SENTINEL LYMPH NODE BX
Anesthesia: General | Site: Breast | Laterality: Right | Wound class: Clean

## 2012-12-10 MED ORDER — 0.9 % SODIUM CHLORIDE (POUR BTL) OPTIME
TOPICAL | Status: DC | PRN
  Administered 2012-12-10: 1000 mL

## 2012-12-10 MED ORDER — METOCLOPRAMIDE HCL 5 MG/ML IJ SOLN
INTRAMUSCULAR | Status: DC | PRN
  Administered 2012-12-10: 5 mg via INTRAVENOUS

## 2012-12-10 MED ORDER — SODIUM CHLORIDE 0.9 % IJ SOLN
INTRAMUSCULAR | Status: DC | PRN
  Administered 2012-12-10: 11:00:00

## 2012-12-10 MED ORDER — ONDANSETRON HCL 4 MG/2ML IJ SOLN
INTRAMUSCULAR | Status: DC | PRN
  Administered 2012-12-10 (×2): 4 mg via INTRAVENOUS

## 2012-12-10 MED ORDER — OXYCODONE HCL 5 MG/5ML PO SOLN: 5.0000 mg | Freq: Once | ORAL | Status: DC | PRN

## 2012-12-10 MED ORDER — LIDOCAINE HCL (CARDIAC) 20 MG/ML IV SOLN
INTRAVENOUS | Status: DC | PRN
  Administered 2012-12-10: 60 mg via INTRAVENOUS

## 2012-12-10 MED ORDER — TECHNETIUM TC 99M SULFUR COLLOID FILTERED
1.0000 | Freq: Once | INTRAVENOUS | Status: AC | PRN
  Administered 2012-12-10: 1 via INTRADERMAL

## 2012-12-10 MED ORDER — LACTATED RINGERS IV SOLN
INTRAVENOUS | Status: DC | PRN
  Administered 2012-12-10: 10:00:00 via INTRAVENOUS

## 2012-12-10 MED ORDER — HYDROMORPHONE HCL PF 1 MG/ML IJ SOLN
INTRAMUSCULAR | Status: AC
  Filled 2012-12-10: qty 1

## 2012-12-10 MED ORDER — PROPOFOL 10 MG/ML IV BOLUS
INTRAVENOUS | Status: DC | PRN
  Administered 2012-12-10: 200 mg via INTRAVENOUS
  Administered 2012-12-10: 50 mg via INTRAVENOUS

## 2012-12-10 MED ORDER — METHYLENE BLUE 1 % INJ SOLN
INTRAMUSCULAR | Status: AC
  Filled 2012-12-10: qty 10

## 2012-12-10 MED ORDER — BUPIVACAINE-EPINEPHRINE 0.25% -1:200000 IJ SOLN
INTRAMUSCULAR | Status: DC | PRN
  Administered 2012-12-10: 20 mL

## 2012-12-10 MED ORDER — MIDAZOLAM HCL 5 MG/5ML IJ SOLN
INTRAMUSCULAR | Status: DC | PRN
  Administered 2012-12-10: 2 mg via INTRAVENOUS

## 2012-12-10 MED ORDER — HYDROMORPHONE HCL PF 1 MG/ML IJ SOLN
0.2500 mg | INTRAMUSCULAR | Status: DC | PRN
  Administered 2012-12-10 (×3): 0.5 mg via INTRAVENOUS

## 2012-12-10 MED ORDER — ONDANSETRON HCL 4 MG/2ML IJ SOLN: 4.0000 mg | Freq: Once | INTRAMUSCULAR | Status: DC | PRN

## 2012-12-10 MED ORDER — OXYCODONE HCL 5 MG PO TABS: 5.0000 mg | ORAL_TABLET | Freq: Once | ORAL | Status: DC | PRN

## 2012-12-10 MED ORDER — FENTANYL CITRATE 0.05 MG/ML IJ SOLN
INTRAMUSCULAR | Status: DC | PRN
  Administered 2012-12-10 (×3): 50 ug via INTRAVENOUS
  Administered 2012-12-10: 100 ug via INTRAVENOUS
  Administered 2012-12-10 (×2): 50 ug via INTRAVENOUS
  Administered 2012-12-10 (×2): 25 ug via INTRAVENOUS

## 2012-12-10 MED ORDER — HYDROCODONE-ACETAMINOPHEN 5-325 MG PO TABS: 1.0000 | ORAL_TABLET | ORAL | Status: DC | PRN

## 2012-12-10 MED ORDER — LACTATED RINGERS IV SOLN
INTRAVENOUS | Status: DC
  Administered 2012-12-10: 10:00:00 via INTRAVENOUS

## 2012-12-10 SURGICAL SUPPLY — 50 items
APPLIER CLIP 9.375 MED OPEN (MISCELLANEOUS) ×2
BENZOIN TINCTURE PRP APPL 2/3 (GAUZE/BANDAGES/DRESSINGS) ×2 IMPLANT
BINDER BREAST LRG (GAUZE/BANDAGES/DRESSINGS) IMPLANT
BINDER BREAST XLRG (GAUZE/BANDAGES/DRESSINGS) ×2 IMPLANT
BLADE SURG 15 STRL LF DISP TIS (BLADE) ×1 IMPLANT
BLADE SURG 15 STRL SS (BLADE) ×1
CANISTER SUCTION 2500CC (MISCELLANEOUS) ×2 IMPLANT
CHLORAPREP W/TINT 26ML (MISCELLANEOUS) ×2 IMPLANT
CLIP APPLIE 9.375 MED OPEN (MISCELLANEOUS) ×1 IMPLANT
CLOTH BEACON ORANGE TIMEOUT ST (SAFETY) ×2 IMPLANT
CONT SPEC 4OZ CLIKSEAL STRL BL (MISCELLANEOUS) ×2 IMPLANT
COVER PROBE W GEL 5X96 (DRAPES) ×2 IMPLANT
COVER SURGICAL LIGHT HANDLE (MISCELLANEOUS) ×2 IMPLANT
DEVICE DUBIN SPECIMEN MAMMOGRA (MISCELLANEOUS) ×2 IMPLANT
DRAPE LAPAROSCOPIC ABDOMINAL (DRAPES) ×2 IMPLANT
DRSG TEGADERM 4X4.75 (GAUZE/BANDAGES/DRESSINGS) ×6 IMPLANT
ELECT CAUTERY BLADE 6.4 (BLADE) ×2 IMPLANT
ELECT REM PT RETURN 9FT ADLT (ELECTROSURGICAL) ×2
ELECTRODE REM PT RTRN 9FT ADLT (ELECTROSURGICAL) ×1 IMPLANT
GLOVE BIO SURGEON STRL SZ7.5 (GLOVE) ×2 IMPLANT
GLOVE BIOGEL PI IND STRL 7.0 (GLOVE) ×1 IMPLANT
GLOVE BIOGEL PI IND STRL 7.5 (GLOVE) ×1 IMPLANT
GLOVE BIOGEL PI INDICATOR 7.0 (GLOVE) ×1
GLOVE BIOGEL PI INDICATOR 7.5 (GLOVE) ×1
GLOVE SURG SIGNA 7.5 PF LTX (GLOVE) ×4 IMPLANT
GLOVE SURG SS PI 7.0 STRL IVOR (GLOVE) ×2 IMPLANT
GOWN PREVENTION PLUS XLARGE (GOWN DISPOSABLE) ×2 IMPLANT
GOWN STRL NON-REIN LRG LVL3 (GOWN DISPOSABLE) ×4 IMPLANT
KIT BASIN OR (CUSTOM PROCEDURE TRAY) ×2 IMPLANT
KIT MARKER MARGIN INK (KITS) ×2 IMPLANT
KIT ROOM TURNOVER OR (KITS) ×2 IMPLANT
NEEDLE 18GX1X1/2 (RX/OR ONLY) (NEEDLE) ×2 IMPLANT
NEEDLE HYPO 25GX1X1/2 BEV (NEEDLE) ×4 IMPLANT
NS IRRIG 1000ML POUR BTL (IV SOLUTION) ×2 IMPLANT
PACK SURGICAL SETUP 50X90 (CUSTOM PROCEDURE TRAY) ×2 IMPLANT
PAD ARMBOARD 7.5X6 YLW CONV (MISCELLANEOUS) ×2 IMPLANT
PENCIL BUTTON HOLSTER BLD 10FT (ELECTRODE) ×2 IMPLANT
SPONGE GAUZE 4X4 12PLY (GAUZE/BANDAGES/DRESSINGS) ×2 IMPLANT
SPONGE LAP 4X18 X RAY DECT (DISPOSABLE) ×4 IMPLANT
STRIP CLOSURE SKIN 1/2X4 (GAUZE/BANDAGES/DRESSINGS) ×2 IMPLANT
SUT MON AB 4-0 PC3 18 (SUTURE) ×2 IMPLANT
SUT SILK 2 0 SH (SUTURE) IMPLANT
SUT VIC AB 3-0 SH 27 (SUTURE) ×2
SUT VIC AB 3-0 SH 27XBRD (SUTURE) ×2 IMPLANT
SYR BULB 3OZ (MISCELLANEOUS) ×2 IMPLANT
SYR CONTROL 10ML LL (SYRINGE) ×4 IMPLANT
TOWEL OR 17X24 6PK STRL BLUE (TOWEL DISPOSABLE) ×2 IMPLANT
TOWEL OR 17X26 10 PK STRL BLUE (TOWEL DISPOSABLE) ×2 IMPLANT
TUBE CONNECTING 12X1/4 (SUCTIONS) ×2 IMPLANT
YANKAUER SUCT BULB TIP NO VENT (SUCTIONS) ×2 IMPLANT

## 2012-12-10 NOTE — Op Note (Signed)
BREAST LUMPECTOMY WITH NEEDLE LOCALIZATION AND AXILLARY SENTINEL LYMPH NODE BX  Procedure Note  Linda Boyer 12/10/2012   Pre-op Diagnosis: RIGHT BREAST CANCER      Post-op Diagnosis: same  Procedure(s): RIGHT BREAST LUMPECTOMY WITH NEEDLE LOCALIZATION AND AXILLARY SENTINEL LYMPH NODE BX INJECTION OF BLUE DYE Surgeon(s): Shelly Rubenstein, MD  Anesthesia: General  Staff:  Circulator: Simeon Craft, RN Relief Circulator: Eppie Gibson, RN Scrub Person: Janeece Agee Pingue, CST  Estimated Blood Loss: Minimal               Specimens: lumpectomy and node x 1          Jakyria Bleau A   Date: 12/10/2012  Time: 11:24 AM

## 2012-12-10 NOTE — Anesthesia Preprocedure Evaluation (Signed)
Anesthesia Evaluation  Patient identified by MRN, date of birth, ID band Patient awake    Reviewed: Allergy & Precautions, H&P , NPO status , Patient's Chart, lab work & pertinent test results  Airway Mallampati: I TM Distance: >3 FB Neck ROM: Full    Dental  (+) Teeth Intact and Dental Advisory Given   Pulmonary sleep apnea and Continuous Positive Airway Pressure Ventilation ,  breath sounds clear to auscultation        Cardiovascular hypertension, Pt. on medications Rhythm:Regular Rate:Normal     Neuro/Psych    GI/Hepatic   Endo/Other  diabetes, Poorly Controlled, Type 2, Oral Hypoglycemic AgentsMorbid obesity  Renal/GU      Musculoskeletal   Abdominal   Peds  Hematology   Anesthesia Other Findings   Reproductive/Obstetrics                           Anesthesia Physical Anesthesia Plan  ASA: III  Anesthesia Plan: General   Post-op Pain Management:    Induction:   Airway Management Planned: LMA  Additional Equipment:   Intra-op Plan:   Post-operative Plan: Extubation in OR  Informed Consent: I have reviewed the patients History and Physical, chart, labs and discussed the procedure including the risks, benefits and alternatives for the proposed anesthesia with the patient or authorized representative who has indicated his/her understanding and acceptance.   Dental advisory given  Plan Discussed with: CRNA, Anesthesiologist and Surgeon  Anesthesia Plan Comments:         Anesthesia Quick Evaluation

## 2012-12-10 NOTE — Interval H&P Note (Signed)
History and Physical Interval Note: No change in H and P  12/10/2012 9:15 AM  Linda Boyer  has presented today for surgery, with the diagnosis of RIGHT BREAST CANCER   The various methods of treatment have been discussed with the patient and family. After consideration of risks, benefits and other options for treatment, the patient has consented to  Procedure(s) with comments: BREAST LUMPECTOMY WITH NEEDLE LOCALIZATION AND AXILLARY SENTINEL LYMPH NODE BX (Right) - right breast needle localized at breast center of gso at 7:30 lumpectomy and right sentinel lymph node biopsy with nuclear medicine injection at 9:30 and neoprobe as a surgical intervention .  The patient's history has been reviewed, patient examined, no change in status, stable for surgery.  I have reviewed the patient's chart and labs.  Questions were answered to the patient's satisfaction.     Rooney Swails A

## 2012-12-10 NOTE — Preoperative (Signed)
Beta Blockers   Reason not to administer Beta Blockers:Not Applicable 

## 2012-12-10 NOTE — Anesthesia Postprocedure Evaluation (Signed)
  Anesthesia Post-op Note  Patient: Linda Boyer  Procedure(s) Performed: Procedure(s): BREAST LUMPECTOMY WITH NEEDLE LOCALIZATION AND AXILLARY SENTINEL LYMPH NODE BX (Right)  Patient Location: PACU  Anesthesia Type:General  Level of Consciousness: awake, alert  and oriented  Airway and Oxygen Therapy: Patient Spontanous Breathing and Patient connected to nasal cannula oxygen  Post-op Pain: mild  Post-op Assessment: Post-op Vital signs reviewed  Post-op Vital Signs: Reviewed  Complications: No apparent anesthesia complications

## 2012-12-10 NOTE — Transfer of Care (Signed)
Immediate Anesthesia Transfer of Care Note  Patient: Linda Boyer  Procedure(s) Performed: Procedure(s): BREAST LUMPECTOMY WITH NEEDLE LOCALIZATION AND AXILLARY SENTINEL LYMPH NODE BX (Right)  Patient Location: PACU  Anesthesia Type:General  Level of Consciousness: awake, alert  and oriented  Airway & Oxygen Therapy: Patient Spontanous Breathing and Patient connected to nasal cannula oxygen  Post-op Assessment: Report given to PACU RN and Post -op Vital signs reviewed and stable  Post vital signs: Reviewed and stable  Complications: No apparent anesthesia complications

## 2012-12-10 NOTE — Anesthesia Procedure Notes (Signed)
Procedure Name: LMA Insertion Date/Time: 12/10/2012 10:30 AM Performed by: Margaree Mackintosh Pre-anesthesia Checklist: Patient identified, Timeout performed, Emergency Drugs available, Suction available and Patient being monitored Patient Re-evaluated:Patient Re-evaluated prior to inductionOxygen Delivery Method: Circle system utilized Preoxygenation: Pre-oxygenation with 100% oxygen Intubation Type: IV induction LMA: LMA inserted LMA Size: 4.0 Number of attempts: 1 Placement Confirmation: breath sounds checked- equal and bilateral and positive ETCO2 Tube secured with: Tape Dental Injury: Teeth and Oropharynx as per pre-operative assessment

## 2012-12-11 NOTE — Op Note (Signed)
Linda Boyer, Linda Boyer                 ACCOUNT NO.:  [id number]  MEDICAL RECORD NO.:  [id number]  LOCATION:  [facility]                          FACILITY:  [facility]  PHYSICIAN:  Abigail Miyamoto, M.D. DATE OF BIRTH:  [birth year]  DATE OF PROCEDURE:  12/10/2012 DATE OF DISCHARGE:                              OPERATIVE REPORT   PREOPERATIVE DIAGNOSIS:  Right breast cancer.  POSTOPERATIVE DIAGNOSIS:  Right breast cancer.  PROCEDURE: 1. Right breast needle localized lumpectomy and right axillary     sentinel lymph node biopsy. 2. Injection of blue dye.  SURGEON:  Abigail Miyamoto, M.D.  ANESTHESIA:  General and 0.5% Marcaine.  ESTIMATED BLOOD LOSS:  Minimal.  INDICATIONS:  This is a 62 year old female, who was originally recently found to have a cancer in the right breast.  This was confirmed with stereotactic biopsy.  Decision made to proceed with needle localized lumpectomy and right axillary sentinel lymph node biopsy after discussion in the Multidisciplinary Breast Conference.  FINDINGS:  The patient was found to have 1 sentinel lymph node, which was excised.  Radiology confirmed the suspicious area was in the lumpectomy specimen.  PROCEDURE IN DETAIL:  The patient was identified in the holding area and radioactive isotope was injected in the right breast.  She had already had a localization wire placed at the Breast Center.  She was then taken to the operating room.  She was placed on operating room table, and general anesthesia was induced.  She was identified as correct patient. I injected blue dye underneath the areola of the right breast and thoroughly massaged the breast.  Her right breast and axilla were then prepped and draped in usual sterile fashion.  I anesthetized the skin at 12 o'clock position where the localization wire was in place.  I made elliptical incision with a scalpel and took this down to the breast tissue with electrocautery.  I then did a wide lumpectomy going  all way down to the chest wall.  Wide circumferential margins appeared to be achieved.  I completely removed the lumpectomy specimen with electrocautery.  The specimen was x-rayed and the suspicious area with clips were confirmed to be in the lumpectomy specimen by the radiologist.  Next, I brought the Neoprobe onto the field.  I identified an area of increased uptake in the right axilla.  I anesthetized the skin with lidocaine and made a small incision with scalpel.  I then took this into laxity tissue with electrocautery.  I identified one sentinel lymph node with the Neoprobe.  There was no uptake of blue dye into the lymph node.  This node was excised and sent to pathology for evaluation. I, then again evaluated the lymph node basin and no other areas of increased uptake were identified.  There were also no palpable lymph nodes in the axilla.  I then anesthetized both wounds further with Marcaine.  I irrigated the chest wall and the axillary tissue with saline.  I placed surgical clips in the biopsy cavity for mark purposes. Hemostasis appeared to be achieved with cautery.  I then closed both incisions with interrupted 3-0 Vicryl sutures, a running 4-0 Monocryl sutures.  Steri-Strips, gauze, and Tegaderm  were then applied. The patient tolerated the procedure well.  All counts were correct at the end of procedure.  The patient was then extubated in the operating room and taken in stable condition to the recovery room.     Abigail Miyamoto, M.D.     DB/MEDQ  D:  12/10/2012  T:  12/11/2012  Job:  [id number]

## 2012-12-13 ENCOUNTER — Encounter (HOSPITAL_COMMUNITY): Payer: Self-pay | Admitting: Surgery

## 2012-12-13 ENCOUNTER — Telehealth (INDEPENDENT_AMBULATORY_CARE_PROVIDER_SITE_OTHER): Payer: Self-pay | Admitting: General Surgery

## 2012-12-13 NOTE — Telephone Encounter (Signed)
Spoke with patient she has appt for po fu on 12/22/12  4:50 pm

## 2012-12-14 ENCOUNTER — Encounter: Payer: Self-pay | Admitting: *Deleted

## 2012-12-16 ENCOUNTER — Telehealth: Payer: Self-pay | Admitting: Oncology

## 2012-12-16 ENCOUNTER — Encounter: Payer: Self-pay | Admitting: *Deleted

## 2012-12-16 NOTE — Telephone Encounter (Signed)
S/w lisa morrison and she has already taken care of this oncotype order

## 2012-12-16 NOTE — Progress Notes (Signed)
Got the OK from [facility] to order Oncotype Dx test.  Ordered Oncotype Dx test w/ [facility].  Faxed request to [facility].  Called Tammy at [facility] and verified that she received request.  Faxed PAC to [employer].

## 2012-12-22 ENCOUNTER — Ambulatory Visit (INDEPENDENT_AMBULATORY_CARE_PROVIDER_SITE_OTHER): Payer: BC Managed Care – PPO | Admitting: Surgery

## 2012-12-22 ENCOUNTER — Encounter (INDEPENDENT_AMBULATORY_CARE_PROVIDER_SITE_OTHER): Payer: Self-pay | Admitting: Surgery

## 2012-12-22 VITALS — BP 122/74 | HR 111 | Temp 98.8°F | Ht 65.0 in | Wt 234.0 lb

## 2012-12-22 DIAGNOSIS — Z09 Encounter for follow-up examination after completed treatment for conditions other than malignant neoplasm: Secondary | ICD-10-CM

## 2012-12-22 NOTE — Progress Notes (Signed)
Subjective:     Patient ID: Linda Boyer, female   DOB: Mar 11, 1951, 62 y.o.   MRN: [id number]  HPI She is here for first postop visit status post right breast needle localized lumpectomy and sentinel node biopsy. She is doing well and has no complaints  Review of Systems     Objective:   Physical Exam On exam, her incision is healing well. There is no evidence of infection. The final pathology showed nodes negative as well as negative margins    Assessment:     Patient stable postop     Plan:     She will be seeing her oncologist on the  28th  and now wants to have her radiation done here in [place] and sent [place]. I will see her back in 3 months

## 2012-12-23 ENCOUNTER — Encounter: Payer: Self-pay | Admitting: *Deleted

## 2012-12-23 NOTE — Progress Notes (Signed)
Re-faxed PAC to Samantha at [facility]. 

## 2012-12-28 ENCOUNTER — Encounter: Payer: Self-pay | Admitting: *Deleted

## 2012-12-28 NOTE — Progress Notes (Signed)
Received Oncotype Dx results of 17.  Gave copy to MD.  Took copy to Med Rec to scan.  

## 2012-12-31 ENCOUNTER — Ambulatory Visit: Payer: BC Managed Care – PPO | Admitting: Oncology

## 2013-01-07 ENCOUNTER — Telehealth: Payer: Self-pay | Admitting: Oncology

## 2013-01-07 ENCOUNTER — Ambulatory Visit (HOSPITAL_BASED_OUTPATIENT_CLINIC_OR_DEPARTMENT_OTHER): Payer: BC Managed Care – PPO | Admitting: Oncology

## 2013-01-07 VITALS — BP 127/75 | HR 109 | Temp 98.0°F | Resp 20 | Ht 65.0 in | Wt 233.7 lb

## 2013-01-07 DIAGNOSIS — Z170 Estrogen receptor positive status [ER+]: Secondary | ICD-10-CM

## 2013-01-07 DIAGNOSIS — C50919 Malignant neoplasm of unspecified site of unspecified female breast: Secondary | ICD-10-CM

## 2013-01-07 DIAGNOSIS — C50811 Malignant neoplasm of overlapping sites of right female breast: Secondary | ICD-10-CM

## 2013-01-07 MED ORDER — ANASTROZOLE 1 MG PO TABS: 1.0000 mg | ORAL_TABLET | Freq: Every day | ORAL | Status: DC

## 2013-01-07 NOTE — Progress Notes (Signed)
ID: [name]   DOB: November 12, 1950  MR#: [id number]  WUJ#:[id number]  PCP: Ephriam Knuckles GYN:  SU: Abigail Miyamoto OTHER MD: Chipper Herb   HISTORY OF PRESENT ILLNESS: Ms. Giles originally presented with discomfort in the lateral aspect of the left breast. She was referred for mammography to the breast Center 10/30/2011, and this showed dense breast tissue bilaterally but no suspicious finding in the left breast. There was no palpable abnormality. Ultrasound of the left breast was negative   On repeat diagnostic mammography a year later however, 11/01/2012, a spiculated mass was found in the upper right breast. There were a few associated punctate calcifications. An area of focal thickening was palpable and ultrasound confirmed a 1.2 cm irregular hypoechoic mass at the 12:00 position of the right breast 3 cm from the nipple. There was no abnormal adenopathy. The rest of the right breast and the left breast were unremarkable.  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 The MIB-1-1 was 16%. There was no HER-2 amplification.  The patient's subsequent history is as detailed below  INTERVAL HISTORY: [place] for followup of her breast cancer. Since her last visit here, she had her definitive lumpectomy.  REVIEW OF SYSTEMS: She tolerated the surgery well, without unusual pain, fever, or bleeding. A detailed review of systems today was otherwise entirely stable.  PAST MEDICAL HISTORY: Past Medical History  Diagnosis Date  . Breast cancer   . Diabetes mellitus without complication   . Hypertension     sees Dr. Ephriam Knuckles [phone]  . Sleep apnea     "has not used cpap in a long time", sleep study over 5 years ago   hypertension  PAST SURGICAL HISTORY: Past Surgical History  Procedure Laterality Date  . Colonoscopy    . Breast lumpectomy with needle  localization and axillary sentinel lymph node bx Right 12/10/2012    Procedure: BREAST LUMPECTOMY WITH NEEDLE LOCALIZATION AND AXILLARY SENTINEL LYMPH NODE BX;  Surgeon: Shelly Rubenstein, MD;  Location: [facility] OR;  Service: General;  Laterality: Right;  . Breast surgery      FAMILY HISTORY No family history on file. The patient's father died from a myocardial infarction at age 2. The patient's mother died from renal failure at age 66. The patient had 2 half brothers. Both died from lung cancer in the setting of tobacco abuse. The patient has 4 sisters. There is no history of breast or ovarian cancer in the family.  GYNECOLOGIC HISTORY: Menarche age 54, first live birth age 4, she is GX P1. Menopause 2009. She never took hormone replacement.  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 Her husband Rosanne Ashing is a Naval architect, and she makes called to arrange for his loads. Their son Barbara Cower lives in [place]. area where he works as a Curator. The patient has 1 grandchild. She is not a Advice worker.  ADVANCED DIRECTIVES: Not in place  HEALTH MAINTENANCE: History  Substance Use Topics  . Smoking status: Never Smoker   . Smokeless tobacco: Not on file  . Alcohol Use: No     Colonoscopy: 2013  PAP: 2012  Bone density: 2011  Lipid panel:  No Known Allergies  Current Outpatient Prescriptions  Medication Sig Dispense Refill  . fish oil-omega-3 fatty acids 1000 MG capsule Take 1 g by mouth 2 (two) times daily.       Marland Kitchen  glipiZIDE (GLUCOTROL) 10 MG tablet Take 20 mg by mouth 2 (two) times daily before a meal.      . HYDROcodone-acetaminophen (NORCO) 5-325 MG per tablet Take 1-2 tablets by mouth every 4 (four) hours as needed for pain.  30 tablet  1  . lisinopril (PRINIVIL,ZESTRIL) 20 MG tablet Take 20 mg by mouth daily.      . metFORMIN (GLUCOPHAGE) 500 MG tablet Take 1,000 mg by mouth 2 (two) times daily with a meal.      . Multiple  Vitamin (MULTIVITAMIN WITH MINERALS) TABS Take 1 tablet by mouth daily.      . vitamin E 1000 UNIT capsule Take 1,000 Units by mouth daily.       No current facility-administered medications for this visit.    OBJECTIVE: Middle-aged white woman in no acute distress Filed Vitals:   01/07/13 1046  BP: 127/75  Pulse: 109  Temp: 98 F (36.7 C)  Resp: 20     Body mass index is 38.89 kg/(m^2).    ECOG FS: 1  Sclerae unicteric Oropharynx clear No cervical or supraclavicular adenopathy Lungs no rales or rhonchi Heart regular rate and rhythm Abd benign MSK mild kyphosis and scoliosis but no focal spinal tenderness Neuro: nonfocal, well oriented, appropriate affect Breasts: The right breast is status post lumpectomy. The incisions are healing nicely, without dehiscence, erythema, or obvious swelling. The right axilla is benign. The left breast is unremarkable  LAB RESULTS: Lab Results  Component Value Date   WBC 7.9 12/03/2012   NEUTROABS 5.7 11/17/2012   HGB 16.5* 12/03/2012   HCT 45.4 12/03/2012   MCV 92.8 12/03/2012   PLT 168 12/03/2012      Chemistry      Component Value Date/Time   NA 134* 12/03/2012 1404   NA 135* 11/17/2012 1223   K 4.1 12/03/2012 1404   K 4.0 11/17/2012 1223   CL 93* 12/03/2012 1404   CL 96* 11/17/2012 1223   CO2 29 12/03/2012 1404   CO2 22 11/17/2012 1223   BUN 12 12/03/2012 1404   BUN 10.4 11/17/2012 1223   CREATININE 0.65 12/03/2012 1404   CREATININE 1.1 11/17/2012 1223      Component Value Date/Time   CALCIUM 10.8* 12/03/2012 1404   CALCIUM 10.0 11/17/2012 1223   ALKPHOS 74 11/17/2012 1223   AST 32 11/17/2012 1223   ALT 40 11/17/2012 1223   BILITOT 0.91 11/17/2012 1223       Lab Results  Component Value Date   LABCA2 17 11/17/2012    No components found with this basename: [id number]    No results found for this basename: INR,  in the last 168 hours  Urinalysis No results found for this basename: colorurine,  appearanceur,  labspec,  phurine,  glucoseu,  hgbur,   bilirubinur,  ketonesur,  proteinur,  urobilinogen,  nitrite,  leukocytesur    STUDIES: Nm Sentinel Node Inj-no Rpt (breast)  12/10/2012  CLINICAL DATA: right breast cancer   Sulfur colloid was injected intradermally by the nuclear medicine  technologist for breast cancer sentinel node localization.     Mm Rt Plc Breast Loc Dev   1st Lesion  Inc Mammo Guide  12/10/2012  *RADIOLOGY REPORT*  Clinical Data:  Biopsy-proven right breast invasive ductal carcinoma 12 o'clock location, preoperative needle localization  NEEDLE LOCALIZATION WITH MAMMOGRAPHIC GUIDANCE AND SPECIMEN RADIOGRAPH  Comparison:  Previous exams.  Patient presents for needle localization prior to lumpectomy.  I met with the patient and we discussed the  procedure of needle localization including benefits and alternatives. We discussed the high likelihood of a successful procedure. We discussed the risks of the procedure, including infection, bleeding, tissue injury, and further surgery. Informed, written consent was given.  Using mammographic guidance, sterile technique, 2% lidocaine and a 5 cm modified Kopans needle, the mass and ribbon shaped clip were localized using a superior to inferior approach.  The films are marked for Dr. Magnus Ivan.  Specimen radiograph was performed at [facility], and confirms the intact hook wire, ribbon shaped clip, and mass are present in the tissue sample.  The specimen is marked for pathology.  IMPRESSION: Needle localization right breast.  No apparent complications.   Original Report Authenticated By: Christiana Pellant, M.D.    ASSESSMENT: 62 y.o. [place], [place] woman status post right breast lumpectomy and sentinel lymph node sampling 12/10/2012 for a pT1b pN0, stage IA invasive ductal carcinoma, grade 1, estrogen receptor 86% and progesterone receptor 100% positive, with an MIB-1 of 16% and no HER-2 amplification   (1) Oncotype DX score of 17 predicts a distant recurrence rate of 11% within 10 years if the  patient's only systemic treatment is tamoxifen for 5 years.  PLAN: We spent the better part of today's 45 minute visit going over the results of her surgery. Although she understands "the surgeon got it all", that does not mean she is not at some risk for distant recurrence. For that reason she will need systemic as well as local therapy. However, her Oncotype predicts no significant risk reduction from chemotherapy. The plan, accordingly, is for her to proceed to radiation, after which we will start antiestrogens.  Because the patient lives so far away, we started talking about anti-estrogens today, and she has a good understanding of the possible toxicities, side effects and complications of tamoxifen and anastrozole. We are going to go with anastrozole initially, and I gave her the prescription today. She will not start this medication until she completes her radiation treatments. She will see me approximately 2 months later, and at that time if she is tolerating the anastrozole without significant complications. We will also set her up for a bone density after that visit.  One do is very pleased with this plan. She knows to call for any problems that may develop before she returns here.     MAGRINAT,GUSTAV C    01/07/2013

## 2013-01-07 NOTE — Telephone Encounter (Signed)
gv pt appt schedule for July.  

## 2013-01-16 ENCOUNTER — Other Ambulatory Visit: Payer: Self-pay | Admitting: Oncology

## 2013-01-17 ENCOUNTER — Encounter: Payer: Self-pay | Admitting: Oncology

## 2013-01-17 ENCOUNTER — Telehealth: Payer: Self-pay | Admitting: *Deleted

## 2013-01-17 NOTE — Telephone Encounter (Signed)
sw pt informed her that AGB will be on pal 04/19/13. gv appt d/t for 05/02/13 lab @ 1:15pm and ov @ 1:45pm. Also made her aware that i will mail letter/cal at her request.

## 2013-01-18 ENCOUNTER — Ambulatory Visit
Admission: RE | Admit: 2013-01-18 | Discharge: 2013-01-18 | Disposition: A | Discharge status: Home / Self Care | Payer: BC Managed Care – PPO | Source: Ambulatory Visit | Attending: Radiation Oncology | Admitting: Radiation Oncology

## 2013-01-18 ENCOUNTER — Encounter: Payer: Self-pay | Admitting: Radiation Oncology

## 2013-01-18 VITALS — BP 132/69 | HR 96 | Temp 98.5°F | Ht 65.0 in | Wt 237.9 lb

## 2013-01-18 DIAGNOSIS — C50811 Malignant neoplasm of overlapping sites of right female breast: Secondary | ICD-10-CM

## 2013-01-18 DIAGNOSIS — Z510 Encounter for antineoplastic radiation therapy: Secondary | ICD-10-CM | POA: Insufficient documentation

## 2013-01-18 DIAGNOSIS — Y842 Radiological procedure and radiotherapy as the cause of abnormal reaction of the patient, or of later complication, without mention of misadventure at the time of the procedure: Secondary | ICD-10-CM | POA: Insufficient documentation

## 2013-01-18 DIAGNOSIS — Z170 Estrogen receptor positive status [ER+]: Secondary | ICD-10-CM | POA: Insufficient documentation

## 2013-01-18 DIAGNOSIS — L988 Other specified disorders of the skin and subcutaneous tissue: Secondary | ICD-10-CM | POA: Insufficient documentation

## 2013-01-18 DIAGNOSIS — C50919 Malignant neoplasm of unspecified site of unspecified female breast: Secondary | ICD-10-CM | POA: Insufficient documentation

## 2013-01-18 NOTE — Progress Notes (Addendum)
CC: Dr. Abigail Miyamoto, Dr. 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 Baria at the [facility] on 11/17/2012. She presented with a mass along the superior aspect of her right breast at 12:00, 3 cm from the nipple. She underwent an ultrasound guided needle core biopsy on 11/01/2012 which was diagnostic for invasive ductal carcinoma. This was ER positive at 86% and PR positive at 100% with a Ki-67 proliferation marker of 16%. I made a referral to [facility] were she was seen in consultation by Dr. Carolin Sicks. She underwent a right partial mastectomy and sentinel lymph node biopsy on 12/10/2012. Her primary tumor measured 1.1 cm and was 0.7 cm from the closest margin anteriorly. She also had a minor DCIS component also 0.7 cm from the closest margin. Her Oncotype DX score was 17. She was seen by Dr. Darnelle Catalan will start her on anastrozole following her radiation therapy. She wnts to have her radiation therapy here in [place] so she can have all of her care in one place.  His examination: Alert and oriented.  Filed Vitals:   01/18/13 1237  BP: 132/69  Pulse: 96  Temp: 98.5 F (36.9 C)   Head and neck examination: Grossly unremarkable. Nodes: Without palpable cervical, supraclavicular, or axillary lymphadenopathy. Chest: Lungs clear. Breasts: She is large breasted. There is a partial mastectomy wound along the superior aspect of the right breast. Her wound is well-healed. No masses are appreciated. Left breast without masses or lesions. Abdomen without hepatomegaly. Extremities: Without edema.  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 She is an excellent candidate for breast preservation. I again discussed the potential  acute and late toxicities of radiation therapy. Consent is signed today. In order to expedite her treatment she will undergo simulation/treatment planning today and begin her treatment later next week or early the following week.   30 minutes was spent face-to-face with the patient, primarily counseling the patient and coordinating her care.   Marland Kitchen

## 2013-01-18 NOTE — Addendum Note (Signed)
Encounter addended by: Eduardo Osier, RN on: 01/18/2013  5:33 PM<BR>     Documentation filed: Charges VN

## 2013-01-18 NOTE — Progress Notes (Signed)
CT simulation/treatment planning note: The patient was taken to the CT simulator. She was placed on a custom breast board and a custom neck mold was constructed for immobilization. Her right breast field borders were marked along with a partial mastectomy scar along the superior breast. She was then scanned. I contoured her lumpectomy tumor bed. She was set up to medial and lateral right breast tangents. 2 sets of multileaf collimators were designed to conform the field. I prescribing 5040 cGy in 28 sessions utilizing mixed energy photons including 10 MV photons.

## 2013-01-18 NOTE — Progress Notes (Signed)
Please see the Nurse Progress Note in the MD Initial Consult Encounter for this patient. 

## 2013-01-18 NOTE — Progress Notes (Signed)
Ms. Wiehe here for a consult for right breast cancer, ER/PR +.  She denies pain and fatigue.

## 2013-01-31 ENCOUNTER — Ambulatory Visit
Admission: RE | Admit: 2013-01-31 | Discharge: 2013-01-31 | Disposition: A | Discharge status: Home / Self Care | Payer: BC Managed Care – PPO | Source: Ambulatory Visit | Attending: Radiation Oncology | Admitting: Radiation Oncology

## 2013-01-31 ENCOUNTER — Encounter: Payer: Self-pay | Admitting: Radiation Oncology

## 2013-01-31 DIAGNOSIS — C50811 Malignant neoplasm of overlapping sites of right female breast: Secondary | ICD-10-CM

## 2013-01-31 NOTE — Progress Notes (Signed)
  Radiation Oncology         [phone] ________________________________  Name: Linda Boyer MRN: [id number]  Date: 01/31/2013  DOB: 1950/10/25  Simulation Verification Note  Status: outpatient  NARRATIVE: The patient was brought to the treatment unit and placed in the planned treatment position. The clinical setup was verified. Then port films were obtained and uploaded to the radiation oncology medical record software.  The treatment beams were carefully compared against the planned radiation fields. The position location and shape of the radiation fields was reviewed. They targeted volume of tissue appears to be appropriately covered by the radiation beams. Organs at risk appear to be excluded as planned.  Based on my personal review, I approved the simulation verification. The patient's treatment will proceed as planned.  -----------------------------------  Billie Lade, PhD, MD

## 2013-01-31 NOTE — Progress Notes (Signed)
Simulation verification note: The patient underwent simulation verification for treatment to her right breast. Her isocenter is in good position and the multileaf collimators contoured the treatment volume appropriately. 

## 2013-02-01 ENCOUNTER — Ambulatory Visit
Admission: RE | Admit: 2013-02-01 | Discharge: 2013-02-01 | Disposition: A | Discharge status: Home / Self Care | Payer: BC Managed Care – PPO | Source: Ambulatory Visit | Attending: Radiation Oncology | Admitting: Radiation Oncology

## 2013-02-02 ENCOUNTER — Ambulatory Visit
Admission: RE | Admit: 2013-02-02 | Discharge: 2013-02-02 | Disposition: A | Discharge status: Home / Self Care | Payer: BC Managed Care – PPO | Source: Ambulatory Visit | Attending: Radiation Oncology | Admitting: Radiation Oncology

## 2013-02-03 ENCOUNTER — Ambulatory Visit
Admission: RE | Admit: 2013-02-03 | Discharge: 2013-02-03 | Disposition: A | Discharge status: Home / Self Care | Payer: BC Managed Care – PPO | Source: Ambulatory Visit | Attending: Radiation Oncology | Admitting: Radiation Oncology

## 2013-02-04 ENCOUNTER — Ambulatory Visit
Admission: RE | Admit: 2013-02-04 | Discharge: 2013-02-04 | Disposition: A | Discharge status: Home / Self Care | Payer: BC Managed Care – PPO | Source: Ambulatory Visit | Attending: Radiation Oncology | Admitting: Radiation Oncology

## 2013-02-07 ENCOUNTER — Ambulatory Visit
Admission: RE | Admit: 2013-02-07 | Discharge: 2013-02-07 | Disposition: A | Discharge status: Home / Self Care | Payer: BC Managed Care – PPO | Source: Ambulatory Visit | Attending: Radiation Oncology | Admitting: Radiation Oncology

## 2013-02-07 VITALS — BP 125/67 | HR 88 | Temp 98.5°F | Ht 65.0 in | Wt 235.7 lb

## 2013-02-07 DIAGNOSIS — C50811 Malignant neoplasm of overlapping sites of right female breast: Secondary | ICD-10-CM

## 2013-02-07 MED ORDER — ALRA NON-METALLIC DEODORANT (RAD-ONC)
1.0000 "application " | Freq: Once | TOPICAL | Status: AC
  Administered 2013-02-07: 1 via TOPICAL

## 2013-02-07 MED ORDER — RADIAPLEXRX EX GEL
Freq: Once | CUTANEOUS | Status: AC
  Administered 2013-02-07: 14:00:00 via TOPICAL

## 2013-02-07 NOTE — Progress Notes (Signed)
Ms. Love here for weekly under treat visit.  She has had 5/28 fractions to her right breast. She denies fatigue.  The skin on her right breast is pink.  She was given the radiation therapy and you booklet and educated on the side effects of radiation including fatigue and skin changes.  She was given alra deoderant and radiaplex gel.  She was instructed to use the radiaplex gel twice a day with the first application at least 4 hours before her treatment time. She was advised to contact nursing with any questions or concerns.

## 2013-02-07 NOTE — Progress Notes (Signed)
Weekly Management Note:   Site: Right breast Current Dose:  900  cGy Projected Dose: 5040  cGy, no boost  Narrative: The patient is seen today for routine under treatment assessment. CBCT/MVCT images/port films were reviewed. The chart was reviewed.   No complaints today. She has Radioplex gel to use when necessary.  Physical Examination:  Filed Vitals:   02/07/13 1348  BP: 125/67  Pulse: 88  Temp: 98.5 F (36.9 C)  .  Weight: 235 lb 11.2 oz (106.913 kg). No skin changes.  Impression: Tolerating radiation therapy well.  Plan: Continue radiation therapy as planned.

## 2013-02-08 ENCOUNTER — Ambulatory Visit
Admission: RE | Admit: 2013-02-08 | Discharge: 2013-02-08 | Disposition: A | Discharge status: Home / Self Care | Payer: BC Managed Care – PPO | Source: Ambulatory Visit | Attending: Radiation Oncology | Admitting: Radiation Oncology

## 2013-02-09 ENCOUNTER — Ambulatory Visit
Admission: RE | Admit: 2013-02-09 | Discharge: 2013-02-09 | Disposition: A | Discharge status: Home / Self Care | Payer: BC Managed Care – PPO | Source: Ambulatory Visit | Attending: Radiation Oncology | Admitting: Radiation Oncology

## 2013-02-10 ENCOUNTER — Ambulatory Visit
Admission: RE | Admit: 2013-02-10 | Discharge: 2013-02-10 | Disposition: A | Discharge status: Home / Self Care | Payer: BC Managed Care – PPO | Source: Ambulatory Visit | Attending: Radiation Oncology | Admitting: Radiation Oncology

## 2013-02-11 ENCOUNTER — Ambulatory Visit
Admission: RE | Admit: 2013-02-11 | Discharge: 2013-02-11 | Disposition: A | Discharge status: Home / Self Care | Payer: BC Managed Care – PPO | Source: Ambulatory Visit | Attending: Radiation Oncology | Admitting: Radiation Oncology

## 2013-02-14 ENCOUNTER — Ambulatory Visit
Admission: RE | Admit: 2013-02-14 | Discharge: 2013-02-14 | Disposition: A | Discharge status: Home / Self Care | Payer: BC Managed Care – PPO | Source: Ambulatory Visit | Attending: Radiation Oncology | Admitting: Radiation Oncology

## 2013-02-14 ENCOUNTER — Encounter: Payer: Self-pay | Admitting: Radiation Oncology

## 2013-02-14 VITALS — BP 117/61 | HR 88 | Temp 98.6°F | Resp 20 | Wt 239.9 lb

## 2013-02-14 DIAGNOSIS — C50811 Malignant neoplasm of overlapping sites of right female breast: Secondary | ICD-10-CM

## 2013-02-14 NOTE — Progress Notes (Signed)
Weekly rad tx r breast, 10 completed so far, alert, oriented x3, ambulatory, no c/o pain, does have mild erythema, skin intact, no c/o pain Using radiaplex gel bid 1:30 PM

## 2013-02-14 NOTE — Progress Notes (Signed)
Weekly Management Note:  Site: Right breast Current Dose:  1800  cGy Projected Dose: 5040  CGy, no boost  Narrative: The patient is seen today for routine under treatment assessment. CBCT/MVCT images/port films were reviewed. The chart was reviewed.   No complaints today. She uses Radioplex gel twice a day.  Physical Examination:  Filed Vitals:   02/14/13 1327  BP: 117/61  Pulse: 88  Temp: 98.6 F (37 C)  Resp: 20  .  Weight: 239 lb 14.4 oz (108.818 kg). Faint erythema the skin along the right breast with no areas of desquamation.  Impression: Tolerating radiation therapy well.  Plan: Continue radiation therapy as planned.

## 2013-02-15 ENCOUNTER — Ambulatory Visit
Admission: RE | Admit: 2013-02-15 | Discharge: 2013-02-15 | Disposition: A | Discharge status: Home / Self Care | Payer: BC Managed Care – PPO | Source: Ambulatory Visit | Attending: Radiation Oncology | Admitting: Radiation Oncology

## 2013-02-16 ENCOUNTER — Ambulatory Visit
Admission: RE | Admit: 2013-02-16 | Discharge: 2013-02-16 | Disposition: A | Discharge status: Home / Self Care | Payer: BC Managed Care – PPO | Source: Ambulatory Visit | Attending: Radiation Oncology | Admitting: Radiation Oncology

## 2013-02-17 ENCOUNTER — Ambulatory Visit
Admission: RE | Admit: 2013-02-17 | Discharge: 2013-02-17 | Disposition: A | Discharge status: Home / Self Care | Payer: BC Managed Care – PPO | Source: Ambulatory Visit | Attending: Radiation Oncology | Admitting: Radiation Oncology

## 2013-02-18 ENCOUNTER — Ambulatory Visit
Admission: RE | Admit: 2013-02-18 | Discharge: 2013-02-18 | Disposition: A | Discharge status: Home / Self Care | Payer: BC Managed Care – PPO | Source: Ambulatory Visit | Attending: Radiation Oncology | Admitting: Radiation Oncology

## 2013-02-21 ENCOUNTER — Ambulatory Visit
Admission: RE | Admit: 2013-02-21 | Discharge: 2013-02-21 | Disposition: A | Discharge status: Home / Self Care | Payer: BC Managed Care – PPO | Source: Ambulatory Visit | Attending: Radiation Oncology | Admitting: Radiation Oncology

## 2013-02-21 ENCOUNTER — Encounter: Payer: Self-pay | Admitting: Radiation Oncology

## 2013-02-21 VITALS — BP 102/64 | HR 99 | Temp 98.0°F | Ht 65.0 in | Wt 236.7 lb

## 2013-02-21 DIAGNOSIS — C50811 Malignant neoplasm of overlapping sites of right female breast: Secondary | ICD-10-CM

## 2013-02-21 NOTE — Progress Notes (Signed)
   Weekly Management Note:  outpatient Current Dose:  27 Gy  Projected Dose: 50.4 Gy   Narrative:  The patient presents for routine under treatment assessment.  CBCT/MVCT images/Port film x-rays were reviewed.  The chart was checked. No complaints  Physical Findings:  height is 5\' 5"  (1.651 m) and weight is 236 lb 11.2 oz (107.366 kg). Her temperature is 98 F (36.7 C). Her blood pressure is 102/64 and her pulse is 99.  moderate erythema of right breast, more pronounced at IM fold. Skin intact  Impression:  The patient is tolerating radiotherapy.  Plan:  Continue radiotherapy as planned.  ________________________________   Lonie Peak, M.D.

## 2013-02-21 NOTE — Progress Notes (Signed)
Ms. Costin has received 15 fractions to her right breast.  Bright erythema noted in treatment field inclusive of the inframmary fold, but her skin remains intact..  She denies any pain and denies fatigue.

## 2013-02-22 ENCOUNTER — Ambulatory Visit
Admission: RE | Admit: 2013-02-22 | Discharge: 2013-02-22 | Disposition: A | Discharge status: Home / Self Care | Payer: BC Managed Care – PPO | Source: Ambulatory Visit | Attending: Radiation Oncology | Admitting: Radiation Oncology

## 2013-02-23 ENCOUNTER — Ambulatory Visit
Admission: RE | Admit: 2013-02-23 | Discharge: 2013-02-23 | Disposition: A | Discharge status: Home / Self Care | Payer: BC Managed Care – PPO | Source: Ambulatory Visit | Attending: Radiation Oncology | Admitting: Radiation Oncology

## 2013-02-24 ENCOUNTER — Ambulatory Visit
Admission: RE | Admit: 2013-02-24 | Discharge: 2013-02-24 | Disposition: A | Discharge status: Home / Self Care | Payer: BC Managed Care – PPO | Source: Ambulatory Visit | Attending: Radiation Oncology | Admitting: Radiation Oncology

## 2013-02-25 ENCOUNTER — Ambulatory Visit
Admission: RE | Admit: 2013-02-25 | Discharge: 2013-02-25 | Disposition: A | Discharge status: Home / Self Care | Payer: BC Managed Care – PPO | Source: Ambulatory Visit | Attending: Radiation Oncology | Admitting: Radiation Oncology

## 2013-02-28 ENCOUNTER — Ambulatory Visit
Admission: RE | Admit: 2013-02-28 | Discharge: 2013-02-28 | Disposition: A | Discharge status: Home / Self Care | Payer: BC Managed Care – PPO | Source: Ambulatory Visit | Attending: Radiation Oncology | Admitting: Radiation Oncology

## 2013-02-28 ENCOUNTER — Encounter: Payer: Self-pay | Admitting: Radiation Oncology

## 2013-02-28 VITALS — BP 116/72 | HR 103 | Temp 98.0°F | Resp 20 | Wt 236.6 lb

## 2013-02-28 DIAGNOSIS — C50811 Malignant neoplasm of overlapping sites of right female breast: Secondary | ICD-10-CM

## 2013-02-28 NOTE — Progress Notes (Signed)
Weekly rad txs rt breast, 20 completed so far,alert,oriented x3, erythema and start of dermatitis on breast, under inframmary foldy,skin thinning, shiny,but skin intact, voiced no c/o pain, no itching,  Uses radiaplex gel bid eating well,  1:37 PM

## 2013-02-28 NOTE — Progress Notes (Signed)
Weekly Management Note:  Site: Right breast Current Dose:  3600  cGy Projected Dose: 5000  cGy  Narrative: The patient is seen today for routine under treatment assessment. CBCT/MVCT images/port films were reviewed. The chart was reviewed.   She is without complaints today. She uses Radioplex gel.  Physical Examination:  Filed Vitals:   02/28/13 1333  BP: 116/72  Pulse: 103  Temp: 98 F (36.7 C)  Resp: 20  .  Weight: 236 lb 9.6 oz (107.321 kg). There is mild erythema along the right breast with more intense erythema along the inframammary fold but no areas of desquamation.  Impression: Tolerating radiation therapy well.  Plan: Continue radiation therapy as planned.

## 2013-03-01 ENCOUNTER — Ambulatory Visit
Admission: RE | Admit: 2013-03-01 | Discharge: 2013-03-01 | Disposition: A | Discharge status: Home / Self Care | Payer: BC Managed Care – PPO | Source: Ambulatory Visit | Attending: Radiation Oncology | Admitting: Radiation Oncology

## 2013-03-01 DIAGNOSIS — C50811 Malignant neoplasm of overlapping sites of right female breast: Secondary | ICD-10-CM

## 2013-03-01 MED ORDER — RADIAPLEXRX EX GEL
Freq: Once | CUTANEOUS | Status: AC
  Administered 2013-03-01: 14:00:00 via TOPICAL

## 2013-03-02 ENCOUNTER — Ambulatory Visit
Admission: RE | Admit: 2013-03-02 | Discharge: 2013-03-02 | Disposition: A | Discharge status: Home / Self Care | Payer: BC Managed Care – PPO | Source: Ambulatory Visit | Attending: Radiation Oncology | Admitting: Radiation Oncology

## 2013-03-03 ENCOUNTER — Ambulatory Visit
Admission: RE | Admit: 2013-03-03 | Discharge: 2013-03-03 | Disposition: A | Discharge status: Home / Self Care | Payer: BC Managed Care – PPO | Source: Ambulatory Visit | Attending: Radiation Oncology | Admitting: Radiation Oncology

## 2013-03-04 ENCOUNTER — Ambulatory Visit
Admission: RE | Admit: 2013-03-04 | Discharge: 2013-03-04 | Disposition: A | Discharge status: Home / Self Care | Payer: BC Managed Care – PPO | Source: Ambulatory Visit | Attending: Radiation Oncology | Admitting: Radiation Oncology

## 2013-03-08 ENCOUNTER — Encounter: Payer: Self-pay | Admitting: Radiation Oncology

## 2013-03-08 ENCOUNTER — Ambulatory Visit
Admission: RE | Admit: 2013-03-08 | Discharge: 2013-03-08 | Disposition: A | Discharge status: Home / Self Care | Payer: BC Managed Care – PPO | Source: Ambulatory Visit | Attending: Radiation Oncology | Admitting: Radiation Oncology

## 2013-03-08 VITALS — BP 129/80 | HR 97 | Resp 18 | Wt 237.0 lb

## 2013-03-08 DIAGNOSIS — C50811 Malignant neoplasm of overlapping sites of right female breast: Secondary | ICD-10-CM

## 2013-03-08 NOTE — Progress Notes (Signed)
Weekly Management Note:  Site: Right breast Current Dose:  4500  cGy Projected Dose: 5040  cGy  Narrative: The patient is seen today for routine under treatment assessment. CBCT/MVCT images/port films were reviewed. The chart was reviewed.   No new complaints today. She uses Radioplex gel twice a day. She finishes her treatment this Friday.  Physical Examination:  Filed Vitals:   03/08/13 1337  BP: 129/80  Pulse: 97  Resp: 18  .  Weight: 237 lb (107.502 kg). There is diffuse erythema along the right breast with patchy dry desquamation along her lower axilla and right inframammary area. No areas of moist desquamation.  Impression: Tolerating radiation therapy well.  Plan: Continue radiation therapy as planned. One-month followup visit after completion of radiation therapy this Friday.

## 2013-03-08 NOTE — Progress Notes (Signed)
Patient reports mild fatigue. Patient reports that she is unable to sleep because her husband is away. Mild erythema along right breast with more intense hyperpigmentation at intermammary fold. No desquamation of treated breast noted. Patient denies pain. Patient reports using radiaplex bid as directed and understand to continue this regimen for two weeks following completion. Provided patient with an appointment card to follow up in one month from Friday's completion. Also, reviewed fynn and abc flyers with patient. Encouraged patient to contact staff with future needs. Patient verbalized understanding.

## 2013-03-09 ENCOUNTER — Ambulatory Visit
Admission: RE | Admit: 2013-03-09 | Discharge: 2013-03-09 | Disposition: A | Discharge status: Home / Self Care | Payer: BC Managed Care – PPO | Source: Ambulatory Visit | Attending: Radiation Oncology | Admitting: Radiation Oncology

## 2013-03-10 ENCOUNTER — Ambulatory Visit
Admission: RE | Admit: 2013-03-10 | Discharge: 2013-03-10 | Disposition: A | Discharge status: Home / Self Care | Payer: BC Managed Care – PPO | Source: Ambulatory Visit | Attending: Radiation Oncology | Admitting: Radiation Oncology

## 2013-03-10 VITALS — BP 112/63 | HR 100 | Temp 97.7°F | Wt 236.4 lb

## 2013-03-10 DIAGNOSIS — C50811 Malignant neoplasm of overlapping sites of right female breast: Secondary | ICD-10-CM

## 2013-03-10 NOTE — Progress Notes (Signed)
Patient for assessment prior to completion of radiation on tomorrow.There is hyperpigmentation (redness) without peeling.Patient to continue to apply radiaplex twice daily.Knows to call if any questions or concerns prior to one month follow up on Monday July 7th.. Denies pain but does have fatigue and difficulty sleeping.

## 2013-03-10 NOTE — Progress Notes (Signed)
Weekly Management Note:  Site: Right breast Current Dose:  4860  cGy Projected Dose: 5040  cGy  Narrative: The patient is seen today for routine under treatment assessment. CBCT/MVCT images/port films were reviewed. The chart was reviewed.   She is a new complaints today. She uses Radioplex gel twice a day.  Physical Examination:  Filed Vitals:   03/10/13 1335  BP: 112/63  Pulse: 100  Temp: 97.7 F (36.5 C)  .  Weight: 236 lb 6.4 oz (107.23 kg). There is diffuse erythema along the entire right breast with patchy dry desquamation along the inframammary region and axilla. No areas of moist desquamation.  Impression: Tolerating radiation therapy well.  Plan: Continue radiation therapy as planned. She will finish her radiation therapy tomorrow and return for a one-month followup visit.

## 2013-03-11 ENCOUNTER — Ambulatory Visit
Admission: RE | Admit: 2013-03-11 | Discharge: 2013-03-11 | Disposition: A | Discharge status: Home / Self Care | Payer: BC Managed Care – PPO | Source: Ambulatory Visit | Attending: Radiation Oncology | Admitting: Radiation Oncology

## 2013-03-14 ENCOUNTER — Encounter: Payer: Self-pay | Admitting: Radiation Oncology

## 2013-03-14 NOTE — Progress Notes (Signed)
[facility] Radiation Oncology End of Treatment Note  Name:Shakeisha NEYRA PETTIE  Date: 03/14/2013 ZOX:[id number] DOB:03-23-1951   Status:outpatient    CC: Elmarie Mainland, MD  Dr. Abigail Miyamoto  REFERRING PHYSICIAN:  Dr. 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 Walkup tolerated treatment well   with development of moderate erythema and dry desquamation of the skin, particularly along the right inframammary region during her last week of therapy. She used Radioplex gel when necessary.                   PLAN: Routine followup in one month. Patient instructed to call if questions or worsening complaints in interim.

## 2013-04-04 ENCOUNTER — Ambulatory Visit (INDEPENDENT_AMBULATORY_CARE_PROVIDER_SITE_OTHER): Payer: BC Managed Care – PPO | Admitting: Surgery

## 2013-04-05 ENCOUNTER — Ambulatory Visit: Payer: BC Managed Care – PPO | Admitting: Radiation Oncology

## 2013-04-12 ENCOUNTER — Ambulatory Visit (INDEPENDENT_AMBULATORY_CARE_PROVIDER_SITE_OTHER): Payer: BC Managed Care – PPO | Admitting: Surgery

## 2013-04-18 ENCOUNTER — Ambulatory Visit (INDEPENDENT_AMBULATORY_CARE_PROVIDER_SITE_OTHER): Payer: BC Managed Care – PPO | Admitting: Surgery

## 2013-04-18 ENCOUNTER — Ambulatory Visit
Admission: RE | Admit: 2013-04-18 | Discharge: 2013-04-18 | Disposition: A | Discharge status: Home / Self Care | Payer: BC Managed Care – PPO | Source: Ambulatory Visit | Attending: Radiation Oncology | Admitting: Radiation Oncology

## 2013-04-18 ENCOUNTER — Encounter: Payer: Self-pay | Admitting: Radiation Oncology

## 2013-04-18 ENCOUNTER — Encounter (INDEPENDENT_AMBULATORY_CARE_PROVIDER_SITE_OTHER): Payer: Self-pay | Admitting: Surgery

## 2013-04-18 VITALS — BP 122/72 | HR 80 | Resp 18 | Ht 65.0 in | Wt 236.6 lb

## 2013-04-18 VITALS — BP 118/74 | HR 108 | Temp 97.4°F | Resp 18 | Wt 236.9 lb

## 2013-04-18 DIAGNOSIS — C50811 Malignant neoplasm of overlapping sites of right female breast: Secondary | ICD-10-CM

## 2013-04-18 DIAGNOSIS — Z853 Personal history of malignant neoplasm of breast: Secondary | ICD-10-CM

## 2013-04-18 NOTE — Progress Notes (Signed)
Subjective:     Patient ID: Linda Boyer, female   DOB: [birth year], 62 y.o.   MRN: [id number]  HPI She is here for a 3 month followup regarding her right breast cancer. She has finished her radiation therapy. She has only mild discomfort of the breast. She did point out a small skin lesion on her left ear which she reports has been present for approximately a year  Review of Systems     Objective:   Physical Exam On exam, there still some skin changes in the right breast from the radiation. The incision is well healed and there are no palpable masses. There is no axillary adenopathy.  There is a small less than 1 cm lesion on her left ear may be consistent with a small basal cell or squamous cell skin cancer    Assessment:     Patient stable long-term followup of right breast cancer     Plan:     She will need to be referred to a dermatologist by her primary care physician. Regarding her breast, she is doing well and I will see her back in 6 months

## 2013-04-18 NOTE — Progress Notes (Signed)
Reports arimidex causes nausea. Reports nausea resolved 2 hours after she take the pill. Hyperpigmentation of right breast resolved. Dry desquamation of right nipple noted. Patient denies nipple discharge. Encouraged patient to apply OTC lotion to right breast and nipple. Patient verbalized understanding. Patient reports her energy level has returned to normal.

## 2013-04-18 NOTE — Progress Notes (Signed)
CC: Dr. Ephriam Knuckles, Dr. Abigail Miyamoto  Followup note:  Ms. Linda Boyer returns today approximately 5 weeks following completion of radiation therapy following conservative surgery in the management of her T1 N0 invasive ductal carcinoma the right breast. She is without complaints today except for nausea within 1-2 hours after taking Arimidex. She does bring to my attention an area of "irritation" along her left ear. She's had this for one year. She is to see Dr. Magnus Ivan for a followup visit this afternoon, Dr. Darnelle Catalan for a followup visit in 2 weeks.  Physical examination: Alert and oriented. Filed Vitals:   04/18/13 1320  BP: 118/74  Pulse: 108  Temp: 97.4 F (36.3 C)  Resp: 18   Head and neck examination: There is a scaly erythematous lesion along the mid left helix measuring 2.5 x 1.5 cm. This represents axilla or perhaps a superficial skin cancer. Nodes: Without palpable cervical, supraclavicular, or axillary lymphadenopathy. Breasts: There is residual erythema/hyperpigmentation the skin along the right breast. There is moderate thickening of the right breast, no masses are appreciated. Left breast that masses or lesions. Extremities: Without edema.  Impression: Satisfactory progress. I instructed her to see a dermatologist closer to home regarding her left ear lesion. She had a baseline right breast mammogram late this year or early next year. This can be arranged by Dr. Darnelle Catalan or Dr. Magnus Ivan.  Plan: Followup with Dr. Magnus Ivan this afternoon and Dr. Darnelle Catalan in 2 weeks. Followup with a local dermatologist. Mammography late this year or early next year.

## 2013-04-19 ENCOUNTER — Other Ambulatory Visit: Payer: BC Managed Care – PPO | Admitting: Lab

## 2013-04-19 ENCOUNTER — Ambulatory Visit: Payer: BC Managed Care – PPO | Admitting: Physician Assistant

## 2013-05-02 ENCOUNTER — Ambulatory Visit: Payer: BC Managed Care – PPO | Admitting: Physician Assistant

## 2013-05-02 ENCOUNTER — Other Ambulatory Visit (HOSPITAL_BASED_OUTPATIENT_CLINIC_OR_DEPARTMENT_OTHER): Payer: BC Managed Care – PPO | Admitting: Lab

## 2013-05-02 VITALS — BP 150/80 | HR 81 | Temp 98.1°F | Resp 20 | Ht 65.0 in | Wt 238.8 lb

## 2013-05-02 DIAGNOSIS — C50811 Malignant neoplasm of overlapping sites of right female breast: Secondary | ICD-10-CM

## 2013-05-02 DIAGNOSIS — Z853 Personal history of malignant neoplasm of breast: Secondary | ICD-10-CM

## 2013-05-02 DIAGNOSIS — Z780 Asymptomatic menopausal state: Secondary | ICD-10-CM | POA: Insufficient documentation

## 2013-05-02 DIAGNOSIS — C50919 Malignant neoplasm of unspecified site of unspecified female breast: Secondary | ICD-10-CM

## 2013-05-02 DIAGNOSIS — E119 Type 2 diabetes mellitus without complications: Secondary | ICD-10-CM

## 2013-05-02 LAB — COMPREHENSIVE METABOLIC PANEL (CC13)
ALT: 44 U/L (ref 0–55)
Albumin: 3.3 g/dL — ABNORMAL LOW (ref 3.5–5.0)
CO2: 27 mEq/L (ref 22–29)
Chloride: 96 mEq/L — ABNORMAL LOW (ref 98–109)
Glucose: 407 mg/dl — ABNORMAL HIGH (ref 70–140)
Potassium: 4.3 mEq/L (ref 3.5–5.1)
Sodium: 134 mEq/L — ABNORMAL LOW (ref 136–145)
Total Bilirubin: 0.57 mg/dL (ref 0.20–1.20)
Total Protein: 6.4 g/dL (ref 6.4–8.3)

## 2013-05-02 LAB — CBC WITH DIFFERENTIAL/PLATELET
Basophils Absolute: 0 10*3/uL (ref 0.0–0.1)
Eosinophils Absolute: 0.1 10*3/uL (ref 0.0–0.5)
HGB: 14 g/dL (ref 11.6–15.9)
MCV: 97.8 fL (ref 79.5–101.0)
MONO#: 0.3 10*3/uL (ref 0.1–0.9)
NEUT#: 2.3 10*3/uL (ref 1.5–6.5)
RBC: 4.14 10*6/uL (ref 3.70–5.45)
RDW: 12.7 % (ref 11.2–14.5)
WBC: 3.4 10*3/uL — ABNORMAL LOW (ref 3.9–10.3)
lymph#: 0.7 10*3/uL — ABNORMAL LOW (ref 0.9–3.3)
nRBC: 0 % (ref 0–0)

## 2013-05-02 NOTE — Progress Notes (Signed)
ID: Linda Boyer   DOB: Jul 02, 1951  MR#: [id number]  XLK#:[id number]  PCP: Ephriam Knuckles GYN:  SU: Abigail Miyamoto OTHER MD: Chipper Herb   HISTORY OF PRESENT ILLNESS: [name] originally presented with discomfort in the lateral aspect of the left breast. She was referred for mammography to the breast Center 10/30/2011, and this showed dense breast tissue bilaterally but no suspicious finding in the left breast. There was no palpable abnormality. Ultrasound of the left breast was negative   On repeat diagnostic mammography a year later however, 11/01/2012, a spiculated mass was found in the upper right breast. There were a few associated punctate calcifications. An area of focal thickening was palpable and ultrasound confirmed a 1.2 cm irregular hypoechoic mass at the 12:00 position of the right breast 3 cm from the nipple. There was no abnormal adenopathy. The rest of the right breast and the left breast were unremarkable.  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 The MIB-1-1 was 16%. There was no HER-2 amplification.  The patient's subsequent history is as detailed below  INTERVAL HISTORY: Linda Boyer returns alone today for followup of her right breast carcinoma. Since her last appointment here, she completed radiation therapy in late May, and started on her anastrozole on 03/11/2013. She's tolerating the anastrozole well with the exception of mild nausea in the morning after she takes the pill. She's had no increased hot flashes. She denies any increased joint pain.  Linda Boyer is extremely anxious today , however, regarding the possibility of her cancer coming back, and had many questions today regarding her future followup.   Perhaps of greatest concern today, however, is the fact that Linda Boyer's nonfasting glucose was elevated at 407 today. She is a known diabetic,  currently on metformin and glipizide.   REVIEW OF SYSTEMS: Linda Boyer denies any recent illnesses and has had no fevers or chills. She has a skin lesion on her left ear which is scheduled to be evaluated by dermatologist in a few weeks. She's had no additional skin changes. She denies any abnormal bruising or bleeding, and specifically has had no vaginal bleeding. She tends to have mild constipation. She has a dry cough, thought to be associated with lisinopril. (She was previously prescribed Benicar but could not afford the medication because it is not generic, so she went back to lisinopril.) She has no phlegm production, shortness of breath, chest pain, or palpitations. She's had no abnormal headaches or dizziness. She currently denies any unusual pain.  A detailed review of systems is otherwise stable.   PAST MEDICAL HISTORY: Past Medical History  Diagnosis Date  . Breast cancer     right side, er 86% +, PR 100%  . Diabetes mellitus without complication   . Hypertension     sees Dr. Ephriam Knuckles [phone]  . Sleep apnea     "has not used cpap in a long time", sleep study over 5 years ago   hypertension  PAST SURGICAL HISTORY: Past Surgical History  Procedure Laterality Date  . Colonoscopy    . Breast lumpectomy with needle localization and axillary sentinel lymph node bx Right 12/10/2012    Procedure: BREAST LUMPECTOMY WITH NEEDLE LOCALIZATION AND AXILLARY SENTINEL LYMPH NODE BX;  Surgeon: Shelly Rubenstein, MD;  Location: [facility] OR;  Service: General;  Laterality: Right;  . Breast surgery      FAMILY HISTORY No family history on file. The  patient's father died from a myocardial infarction at age 82. The patient's mother died from renal failure at age 56. The patient had 2 half brothers. Both died from lung cancer in the setting of tobacco abuse. The patient has 4 sisters. There is no history of breast or ovarian cancer in the family.  GYNECOLOGIC HISTORY: Menarche age 54, first  live birth age 48, she is GX P1. Menopause 2009. She never took hormone replacement.  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 Her husband Rosanne Ashing is a Naval architect, and she makes calls to arrange for his loads. Their son Barbara Cower lives in [place] where he works as a Curator. The patient has 1 grandchild. She is not a Advice worker.  ADVANCED DIRECTIVES: Not in place  HEALTH MAINTENANCE: History  Substance Use Topics  . Smoking status: Never Smoker   . Smokeless tobacco: Not on file  . Alcohol Use: No     Colonoscopy: 2013  PAP: 2012  Bone density: 2011  Lipid panel:  No Known Allergies  Current Outpatient Prescriptions  Medication Sig Dispense Refill  . anastrozole (ARIMIDEX) 1 MG tablet Take 1 tablet (1 mg total) by mouth daily.  30 tablet  12  . fish oil-omega-3 fatty acids 1000 MG capsule Take 1 g by mouth 2 (two) times daily.       Marland Kitchen glipiZIDE (GLUCOTROL) 10 MG tablet Take 20 mg by mouth 2 (two) times daily before a meal.      . hyaluronate sodium (RADIAPLEXRX) GEL Apply 1 application topically 2 (two) times daily. 2nd tube given      . lisinopril (PRINIVIL,ZESTRIL) 20 MG tablet Take 20 mg by mouth daily.      Marland Kitchen LORazepam (ATIVAN) 0.5 MG tablet 0.5 mg as needed.       . metFORMIN (GLUCOPHAGE) 500 MG tablet Take 1,000 mg by mouth 2 (two) times daily with a meal.      . Multiple Vitamin (MULTIVITAMIN WITH MINERALS) TABS Take 1 tablet by mouth daily.      . vitamin E 1000 UNIT capsule Take 1,000 Units by mouth daily.       No current facility-administered medications for this visit.    OBJECTIVE: Middle-aged white woman in no acute distress Filed Vitals:   05/02/13 1337  BP: 150/80  Pulse: 81  Temp: 98.1 F (36.7 C)  Resp: 20     Body mass index is 39.74 kg/(m^2).    ECOG FS: 1 Filed Weights   05/02/13 1337  Weight: 238 lb 12.8 oz (108.319 kg)   Sclerae unicteric Oropharynx clear No cervical or  supraclavicular adenopathy Lungs clear to auscultation, no wheezes, no rales or rhonchi Heart regular rate and rhythm Abdomen soft, obese, mild tenderness to palpation diffusely, positive bowel sounds MSK mild kyphosis and scoliosis but no focal spinal tenderness to palpation Nonpitting pedal edema, equal bilaterally, with no upper extremity edema noted Neuro: nonfocal, well oriented, anxious affect Breasts: The right breast is status post lumpectomy and radiation. Mild skin changes associated with radiation, but no suspicious nodularities and no evidence of local recurrence. Left breast is unremarkable. Axillae benign bilaterally, with no palpable adenopathy Skin: There is a dry, crusty, slightly erythematous lesion on the external portion of the left ear, measuring approximately 1 cm at its widest area.    LAB RESULTS: Lab Results  Component Value Date   WBC 3.4* 05/02/2013   NEUTROABS 2.3 05/02/2013  HGB 14.0 05/02/2013   HCT 40.5 05/02/2013   MCV 97.8 05/02/2013   PLT 140* 05/02/2013      Chemistry      Component Value Date/Time   NA 134* 05/02/2013 1317   NA 134* 12/03/2012 1404   K 4.3 05/02/2013 1317   K 4.1 12/03/2012 1404   CL 93* 12/03/2012 1404   CL 96* 11/17/2012 1223   CO2 27 05/02/2013 1317   CO2 29 12/03/2012 1404   BUN 8.3 05/02/2013 1317   BUN 12 12/03/2012 1404   CREATININE 1.0 05/02/2013 1317   CREATININE 0.65 12/03/2012 1404      Component Value Date/Time   CALCIUM 9.7 05/02/2013 1317   CALCIUM 10.8* 12/03/2012 1404   ALKPHOS 65 05/02/2013 1317   AST 37* 05/02/2013 1317   ALT 44 05/02/2013 1317   BILITOT 0.57 05/02/2013 1317       STUDIES:  No results found.   ASSESSMENT: 62 y.o. [place], [place] woman   (1)  status post right breast lumpectomy and sentinel lymph node sampling 12/10/2012 for a pT1b pN0, stage IA invasive ductal carcinoma, grade 1, estrogen receptor 86% and progesterone receptor 100% positive, with an MIB-1 of 16% and no HER-2 amplification   (2)  Oncotype DX score of 17 predicts a distant recurrence rate of 11% within 10 years if the patient's only systemic treatment is tamoxifen for 5 years.  (3)  status post radiation therapy, completed 03/11/2013  (4)  started on anastrozole, 1 mg daily, 03/13/2013    PLAN:  This case was reviewed with Dr. Darnelle Catalan today, and in fact he spent quite a bit of time with the patient today as well, reviewing the details of our followup plan. Over half of today's 1 hour visit was spent reviewing Linda Boyer's many concerns, counseling her regarding her diagnosis, reviewing our followup plan, and coordinating care.  Briggett is extremely nervous about her cancer coming back, but we've reiterated the importance of taking care of more immediate issues, specifically her elevated glucose of 407. I have asked our schedulers to contact her primary care physician, Dr. Ephriam Knuckles in [place] for a followup appointment as soon as possible to evaluate her diabetes control.  She is already scheduled to see Dr. Sibyl Parr in September, but we will add very much like for her to be seen sooner with such a high glucose today.  Linda Boyer is also scheduled to see a dermatologist in [place] in September, and will keep that appointment for further evaluation of the lesion on her left ear.  Linda Boyer is very concerned about her labs today, primarily her platelet count of 140,000 and her slightly elevated AST of 37. We spent quite a bit of time discussing these labs, and agreed to repeat them in 3-4 weeks. She will also return for a bone density test at that time, and in the meanwhile will continue on her anastrozole daily. I did recommend she try taking at night to avoid any nausea. She will see [place] for labs and physical exam in 3 months.  Linda Boyer knows to call prior that time with any changes or problems.   Deshon Hsiao    05/02/2013

## 2013-05-04 LAB — VITAMIN D 25 HYDROXY (VIT D DEFICIENCY, FRACTURES): Vit D, 25-Hydroxy: 31 ng/mL (ref 30–89)

## 2013-05-25 ENCOUNTER — Ambulatory Visit
Admission: RE | Admit: 2013-05-25 | Discharge: 2013-05-25 | Disposition: A | Discharge status: Home / Self Care | Payer: BC Managed Care – PPO | Source: Ambulatory Visit | Attending: Physician Assistant | Admitting: Physician Assistant

## 2013-05-25 ENCOUNTER — Other Ambulatory Visit (HOSPITAL_BASED_OUTPATIENT_CLINIC_OR_DEPARTMENT_OTHER): Payer: BC Managed Care – PPO

## 2013-05-25 DIAGNOSIS — Z853 Personal history of malignant neoplasm of breast: Secondary | ICD-10-CM

## 2013-05-25 DIAGNOSIS — E119 Type 2 diabetes mellitus without complications: Secondary | ICD-10-CM

## 2013-05-25 DIAGNOSIS — C50919 Malignant neoplasm of unspecified site of unspecified female breast: Secondary | ICD-10-CM

## 2013-05-25 DIAGNOSIS — Z780 Asymptomatic menopausal state: Secondary | ICD-10-CM

## 2013-05-25 DIAGNOSIS — C50811 Malignant neoplasm of overlapping sites of right female breast: Secondary | ICD-10-CM

## 2013-05-25 LAB — CBC WITH DIFFERENTIAL/PLATELET
BASO%: 0.4 % (ref 0.0–2.0)
EOS%: 2.2 % (ref 0.0–7.0)
HCT: 41.2 % (ref 34.8–46.6)
LYMPH%: 18.1 % (ref 14.0–49.7)
MCH: 33.2 pg (ref 25.1–34.0)
MCHC: 34.7 g/dL (ref 31.5–36.0)
MONO#: 0.5 10*3/uL (ref 0.1–0.9)
NEUT%: 70.7 % (ref 38.4–76.8)
Platelets: 193 10*3/uL (ref 145–400)
RBC: 4.3 10*6/uL (ref 3.70–5.45)
WBC: 5.5 10*3/uL (ref 3.9–10.3)
lymph#: 1 10*3/uL (ref 0.9–3.3)

## 2013-05-25 LAB — COMPREHENSIVE METABOLIC PANEL (CC13)
ALT: 32 U/L (ref 0–55)
AST: 29 U/L (ref 5–34)
Creatinine: 0.9 mg/dL (ref 0.6–1.1)
Sodium: 138 mEq/L (ref 136–145)
Total Bilirubin: 0.8 mg/dL (ref 0.20–1.20)
Total Protein: 7.4 g/dL (ref 6.4–8.3)

## 2013-06-01 ENCOUNTER — Telehealth: Payer: Self-pay | Admitting: Emergency Medicine

## 2013-06-01 NOTE — Telephone Encounter (Signed)
Patient called inquiring about bone density results.  Per Dr Darnelle Catalan, informed patient that her bone density results were normal.  Per patient's request, a copy of her bone density results will be mailed to her.

## 2013-08-03 ENCOUNTER — Telehealth: Payer: Self-pay | Admitting: Oncology

## 2013-08-03 ENCOUNTER — Encounter (INDEPENDENT_AMBULATORY_CARE_PROVIDER_SITE_OTHER): Payer: Self-pay

## 2013-08-03 ENCOUNTER — Other Ambulatory Visit (HOSPITAL_BASED_OUTPATIENT_CLINIC_OR_DEPARTMENT_OTHER): Payer: BC Managed Care – PPO | Admitting: Lab

## 2013-08-03 ENCOUNTER — Ambulatory Visit (HOSPITAL_BASED_OUTPATIENT_CLINIC_OR_DEPARTMENT_OTHER): Payer: BC Managed Care – PPO | Admitting: Oncology

## 2013-08-03 VITALS — BP 128/75 | HR 106 | Temp 98.3°F | Resp 18 | Ht 65.0 in | Wt 228.6 lb

## 2013-08-03 DIAGNOSIS — C50411 Malignant neoplasm of upper-outer quadrant of right female breast: Secondary | ICD-10-CM | POA: Insufficient documentation

## 2013-08-03 DIAGNOSIS — C50811 Malignant neoplasm of overlapping sites of right female breast: Secondary | ICD-10-CM

## 2013-08-03 DIAGNOSIS — Z170 Estrogen receptor positive status [ER+]: Secondary | ICD-10-CM

## 2013-08-03 DIAGNOSIS — C50919 Malignant neoplasm of unspecified site of unspecified female breast: Secondary | ICD-10-CM

## 2013-08-03 LAB — COMPREHENSIVE METABOLIC PANEL (CC13)
ALT: 33 U/L (ref 0–55)
AST: 25 U/L (ref 5–34)
Alkaline Phosphatase: 66 U/L (ref 40–150)
Anion Gap: 14 mEq/L — ABNORMAL HIGH (ref 3–11)
CO2: 22 mEq/L (ref 22–29)
Creatinine: 1 mg/dL (ref 0.6–1.1)
Total Bilirubin: 1.01 mg/dL (ref 0.20–1.20)

## 2013-08-03 LAB — CBC WITH DIFFERENTIAL/PLATELET
BASO%: 0.6 % (ref 0.0–2.0)
EOS%: 0.5 % (ref 0.0–7.0)
HCT: 43.6 % (ref 34.8–46.6)
LYMPH%: 19.2 % (ref 14.0–49.7)
MCH: 32.7 pg (ref 25.1–34.0)
MCHC: 34.6 g/dL (ref 31.5–36.0)
MCV: 94.6 fL (ref 79.5–101.0)
MONO%: 8 % (ref 0.0–14.0)
NEUT%: 71.7 % (ref 38.4–76.8)
Platelets: 189 10*3/uL (ref 145–400)
RBC: 4.61 10*6/uL (ref 3.70–5.45)

## 2013-08-03 MED ORDER — LOSARTAN POTASSIUM 50 MG PO TABS: 50.0000 mg | ORAL_TABLET | Freq: Every day | ORAL | Status: DC

## 2013-08-03 MED ORDER — TRAZODONE HCL 50 MG PO TABS: 50.0000 mg | ORAL_TABLET | Freq: Every day | ORAL | Status: DC

## 2013-08-03 NOTE — Progress Notes (Signed)
ID: Linda Boyer   DOB: 1951/04/28  MR#: [id number]  WUJ#:[id number]  PCP: Linda Boyer GYN:  SU: Linda Boyer OTHER MD: Linda Boyer   HISTORY OF PRESENT ILLNESS: Linda Boyer originally presented with discomfort in the lateral aspect of the left breast. She was referred for mammography to the breast Center 10/30/2011, and this showed dense breast tissue bilaterally but no suspicious finding in the left breast. There was no palpable abnormality. Ultrasound of the left breast was negative   On repeat diagnostic mammography a year later however, 11/01/2012, a spiculated mass was found in the upper right breast. There were a few associated punctate calcifications. An area of focal thickening was palpable and ultrasound confirmed a 1.2 cm irregular hypoechoic mass at the 12:00 position of the right breast 3 cm from the nipple. There was no abnormal adenopathy. The rest of the right breast and the left breast were unremarkable.  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 The MIB-1-1 was 16%. There was no HER-2 amplification.  The patient's subsequent history is as detailed below  INTERVAL HISTORY: Linda Boyer returns today for followup of her breast cancer. Interval history is significant for her husband having developed severe back problems requiring surgery, which is scheduled for 2 weeks from now. She herself is on anastrozole and tolerating that with no side effects that she is aware of other than mild hot flashes.  REVIEW OF SYSTEMS: Linda Boyer has a persistent cough, which she thinks may be related to her lisinopril, but of course it could be cancer related and this does need to be evaluated. It is not productive, it is not accompanied by fever or worsening shortness of breath or pleurisy, but it is persistent and it was apparent throughout her exam today. She is sleeping very  poorly chiefly because of anxiety. She is anxious about the breast cancer, about money problems, and about her husbands illness. Her left breast heart sometimes and she feels it is Linda Boyer and somewhat different than before. She has had no unusual headaches, visual changes, nausea, vomiting, or change in bowel or bladder habits. A detailed review of systems today was otherwise noncontributory   PAST MEDICAL HISTORY: Past Medical History  Diagnosis Date  . Breast cancer     right side, er 86% +, PR 100%  . Diabetes mellitus without complication   . Hypertension     sees Dr. Ephriam Boyer [phone]  . Sleep apnea     "has not used cpap in a long time", sleep study over 5 years ago   hypertension  PAST SURGICAL HISTORY: Past Surgical History  Procedure Laterality Date  . Colonoscopy    . Breast lumpectomy with needle localization and axillary sentinel lymph node bx Right 12/10/2012    Procedure: BREAST LUMPECTOMY WITH NEEDLE LOCALIZATION AND AXILLARY SENTINEL LYMPH NODE BX;  Surgeon: Linda Rubenstein, MD;  Location: [facility];  Service: General;  Laterality: Right;  . Breast surgery      FAMILY HISTORY No family history on file. The patient's father died from a myocardial infarction at age 89. The patient's mother died from renal failure at age 33. The patient had 2 half brothers. Both died from lung cancer in the setting of tobacco abuse. The patient has 4 sisters. There is no history of breast or ovarian cancer in the family.  GYNECOLOGIC HISTORY: Menarche age 52, first live birth age 53, she  is G[place] P1. Menopause 2009. She never took hormone replacement.  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 Her husband Linda Boyer is a Naval architect, and she makes calls to arrange for his loads. She also accompanies him on some of his long rides, which means she is away from home are quite a bit. Their son Linda Boyer lives in [place]  where he works as a Curator. The patient has 1 grandchild, currently 27 years old. She is not a Advice worker.  ADVANCED DIRECTIVES: Not in place  HEALTH MAINTENANCE: History  Substance Use Topics  . Smoking status: Never Smoker   . Smokeless tobacco: Not on file  . Alcohol Use: No     Colonoscopy: 2013  PAP: 2012  Bone density: 208/13/2014 / normal ([facility])  Lipid panel:  No Known Allergies  Current Outpatient Prescriptions  Medication Sig Dispense Refill  . anastrozole (ARIMIDEX) 1 MG tablet Take 1 tablet (1 mg total) by mouth daily.  30 tablet  12  . fish oil-omega-3 fatty acids 1000 MG capsule Take 1 g by mouth 2 (two) times daily.       Marland Kitchen glipiZIDE (GLUCOTROL) 10 MG tablet Take 20 mg by mouth 2 (two) times daily before a meal.      . hyaluronate sodium (RADIAPLEXRX) GEL Apply 1 application topically 2 (two) times daily. 2nd tube given      . lisinopril (PRINIVIL,ZESTRIL) 20 MG tablet Take 20 mg by mouth daily.      Marland Kitchen LORazepam (ATIVAN) 0.5 MG tablet 0.5 mg as needed.       . metFORMIN (GLUCOPHAGE) 500 MG tablet Take 1,000 mg by mouth 2 (two) times daily with a meal.      . Multiple Vitamin (MULTIVITAMIN WITH MINERALS) TABS Take 1 tablet by mouth daily.      . vitamin E 1000 UNIT capsule Take 1,000 Units by mouth daily.       No current facility-administered medications for this visit.      Objective: Middle-aged white woman who appears stated age 62 Vitals:   08/03/13 1339  BP: 128/75  Pulse: 106  Temp: 98.3 F (36.8 C)  Resp: 18     Body mass index is 38.04 kg/(m^2).    ECOG FS: 1 Filed Weights   08/03/13 1339  Weight: 228 lb 9.6 oz (103.692 kg)   Sclerae unicteric; abrasions in both pinnae, about two thirds of the way down, most likely related to her CPAP straps Oropharynx clear, dentition poor No cervical or supraclavicular adenopathy Lungs clear to auscultation, fair excursion bilaterally Heart regular rate and rhythm Abdomen soft, obese,  nontender, positive bowel sounds MSK mild kyphosis and scoliosis but no focal spinal tenderness No upper extremity lymphedema Neuro: nonfocal, well oriented, anxious affect Breasts: The right breast is status post lumpectomy and radiation. There is no evidence of local recurrence. The right axilla is benign Left breast shows no obvious skin or nipple changes of concern. I do not palpate a definite mass. The left axilla is benign   LAB RESULTS: Lab Results  Component Value Date   WBC 7.1 08/03/2013   NEUTROABS 5.1 08/03/2013   HGB 15.1 08/03/2013   HCT 43.6 08/03/2013   MCV 94.6 08/03/2013   PLT 189 08/03/2013      Chemistry      Component Value Date/Time   NA 133* 08/03/2013 1324   NA 134* 12/03/2012 1404   K 4.7  08/03/2013 1324   K 4.1 12/03/2012 1404   CL 93* 12/03/2012 1404   CL 96* 11/17/2012 1223   CO2 22 08/03/2013 1324   CO2 29 12/03/2012 1404   BUN 11.4 08/03/2013 1324   BUN 12 12/03/2012 1404   CREATININE 1.0 08/03/2013 1324   CREATININE 0.65 12/03/2012 1404      Component Value Date/Time   CALCIUM 11.1* 08/03/2013 1324   CALCIUM 10.8* 12/03/2012 1404   ALKPHOS 66 08/03/2013 1324   AST 25 08/03/2013 1324   ALT 33 08/03/2013 1324   BILITOT 1.01 08/03/2013 1324       STUDIES: Repeat mammography will be due January 2014  ASSESSMENT: 62 y.o. [place], [place] woman   (1)  status post right breast lumpectomy and sentinel lymph node sampling 12/10/2012 for a pT1b pN0, stage IA invasive ductal carcinoma, grade 1, estrogen receptor 86% and progesterone receptor 100% positive, with an MIB-1 of 16% and no HER-2 amplification   (2) Oncotype DX score of 17 predicts a distant recurrence rate of 11% within 10 years if the patient's only systemic treatment is tamoxifen for 5 years.  (3)  status post radiation therapy, completed 03/11/2013  (4)  started on anastrozole, 1 mg daily, 03/13/2013; bone density 05/25/2013 was normal    PLAN:  Daci remains very anxious regarding her  breast cancer. I tried to reassure her by reminding her that by taking aromatase inhibitors, which have been shown to be more effective than tamoxifen, she has a 92% chance of not having the cancer recur. She is very worried that the discomfort and fullness she is feeling in the left breast is going to be cancer. We are going to obtain a left mammogram next week and if necessary a left breast ultrasound to evaluate that.  She does have a persistent cough. The most common reason for this in her situation would be lisinopril. We are going to obtain a CT scan of the chest with contrast is to make sure we are not dealing with metastatic disease. If not I suggested she consider switching to losartan, which may work better for her in terms of the cough.  Finally I wrote her a prescription for trazodone. I think this will help her sleep better at night. I also gave her the phone number of 3 different breast cancer support groups in [place]. My feeling is that if she joined one of those she would feel better as far as her risk of breast cancer is concerned.  She understands I am more concerned about her uncontrolled blood sugar than about her breast cancer. She tells me she has discussed this with Dr. Sibyl Parr, but that she (the patient) does not want to go on insulin as was suggested. She does not feel she can increase her exercise activities at this time given that she plans to spend longer times in the truck with her husband now that he has more back problems.  I have made her a return appointment here for October 31, to discuss results of her studies, but this is optional, and if everything is normal she can cancel that. She will see [place] again in 3 months with lab work and physical exam. She knows to call for any other issues that may develop before her next visit here.  Yurani Fettes C    08/03/2013

## 2013-08-03 NOTE — Telephone Encounter (Signed)
, °

## 2013-08-04 ENCOUNTER — Ambulatory Visit (INDEPENDENT_AMBULATORY_CARE_PROVIDER_SITE_OTHER): Payer: BC Managed Care – PPO | Admitting: Surgery

## 2013-08-04 ENCOUNTER — Encounter (INDEPENDENT_AMBULATORY_CARE_PROVIDER_SITE_OTHER): Payer: Self-pay | Admitting: Surgery

## 2013-08-04 VITALS — BP 127/88 | HR 86 | Temp 98.6°F | Resp 14 | Ht 65.0 in | Wt 226.4 lb

## 2013-08-04 DIAGNOSIS — Z853 Personal history of malignant neoplasm of breast: Secondary | ICD-10-CM

## 2013-08-04 NOTE — Progress Notes (Signed)
Subjective:     Patient ID: Linda Boyer, female   DOB: [birth year], 62 y.o.   MRN: [id number]  HPI She is here for long-term followup of her right breast cancer status post lumpectomy, sentinel node biopsy, and radiation. She has been very anxious and worried about pain in the left breast. She visited her oncologist yesterday and felt that in order. He tried to reassure her.  Review of Systems     Objective:   Physical Exam On exam, her right breast incision is well healed and there are no palpable masses or adenopathy. I can palpate no masses at all in the left breast and no left axillary adenopathy.    Assessment:     Patient stable with a history of breast cancer     Plan:     I also tried to reassure her that I am not worried about recurrent malignancy. She will be getting images next week. I will see her back in 6 months unless these films show something.

## 2013-08-08 IMAGING — MG MM PLC LOC DEV 1ST LESION MAMMO GUIDE
4 series · 4 of 4 positions shown · non-contrast
Comparison: Previous exams.

CLINICAL DATA: Biopsy-proven right breast invasive ductal
carcinoma 12 o'clock location, preoperative needle localization

NEEDLE LOCALIZATION WITH MAMMOGRAPHIC GUIDANCE AND SPECIMEN
RADIOGRAPH

[R CC (1 of 2)]
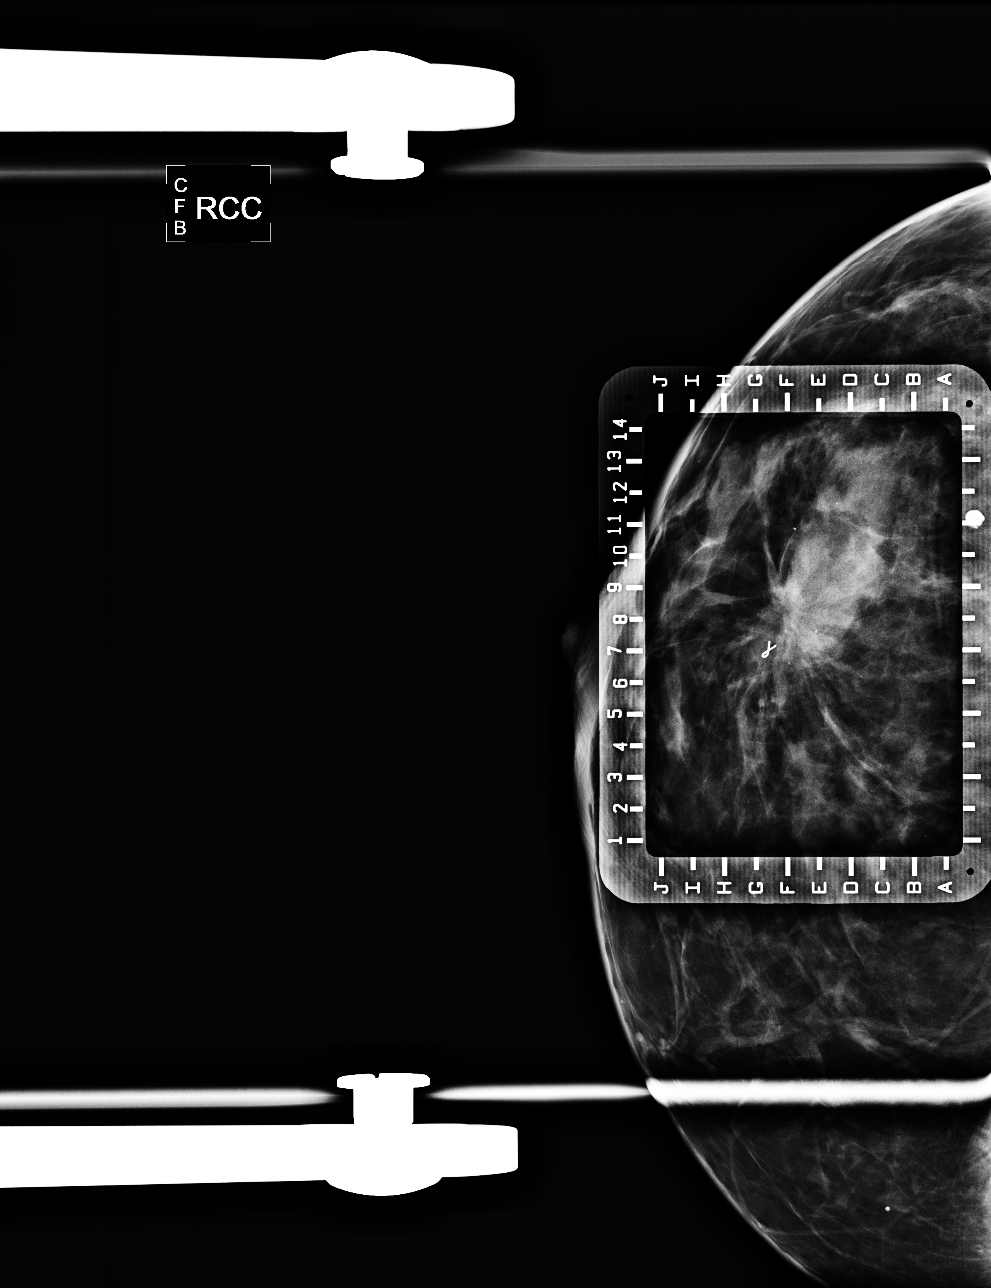

[R CC (2 of 2)]
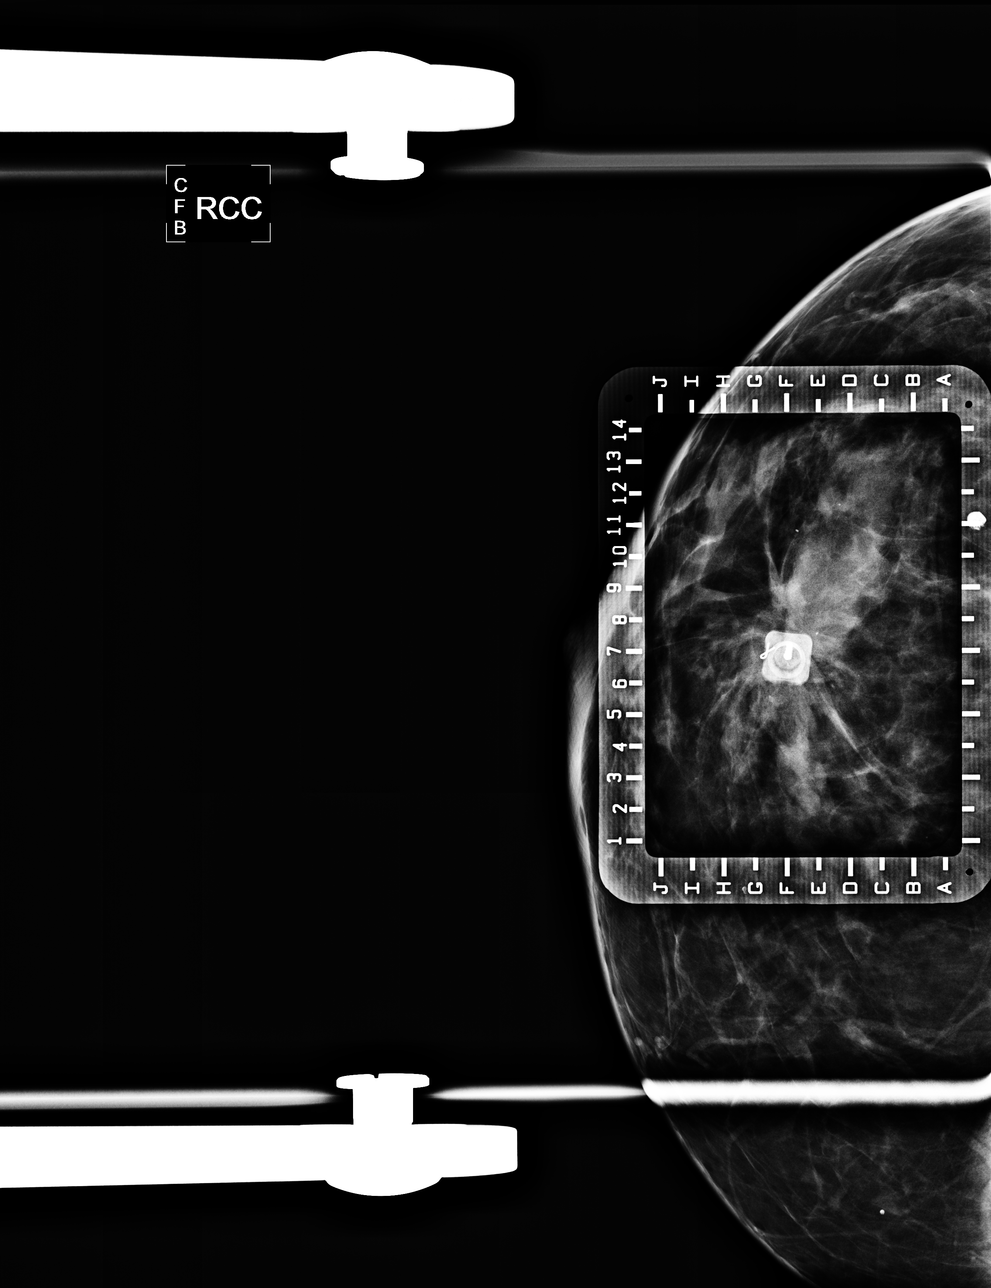

[R ML (1 of 2)]
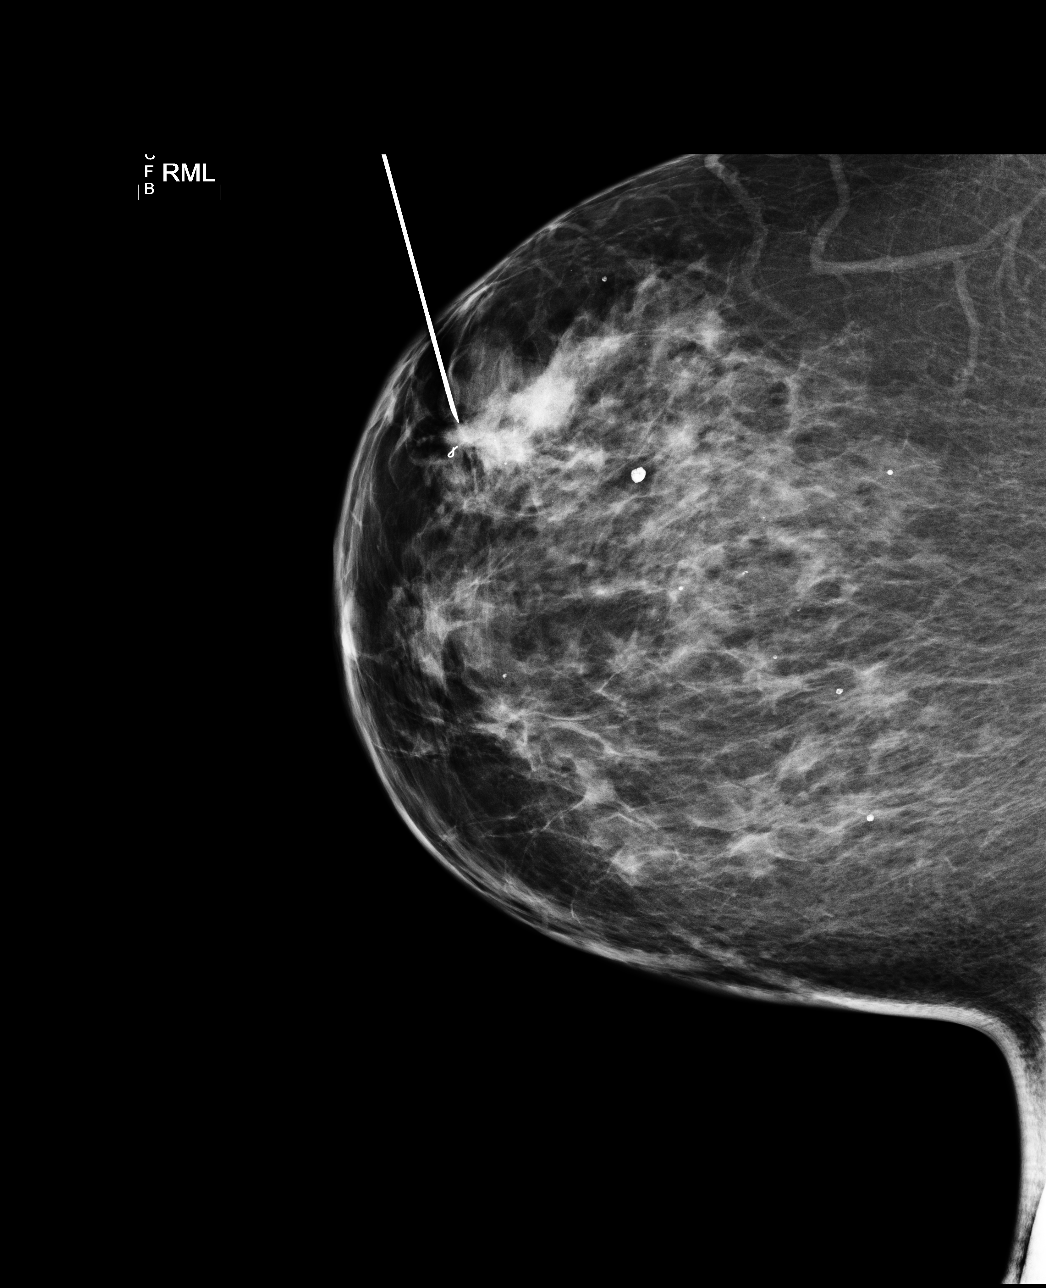

[R ML (2 of 2)]
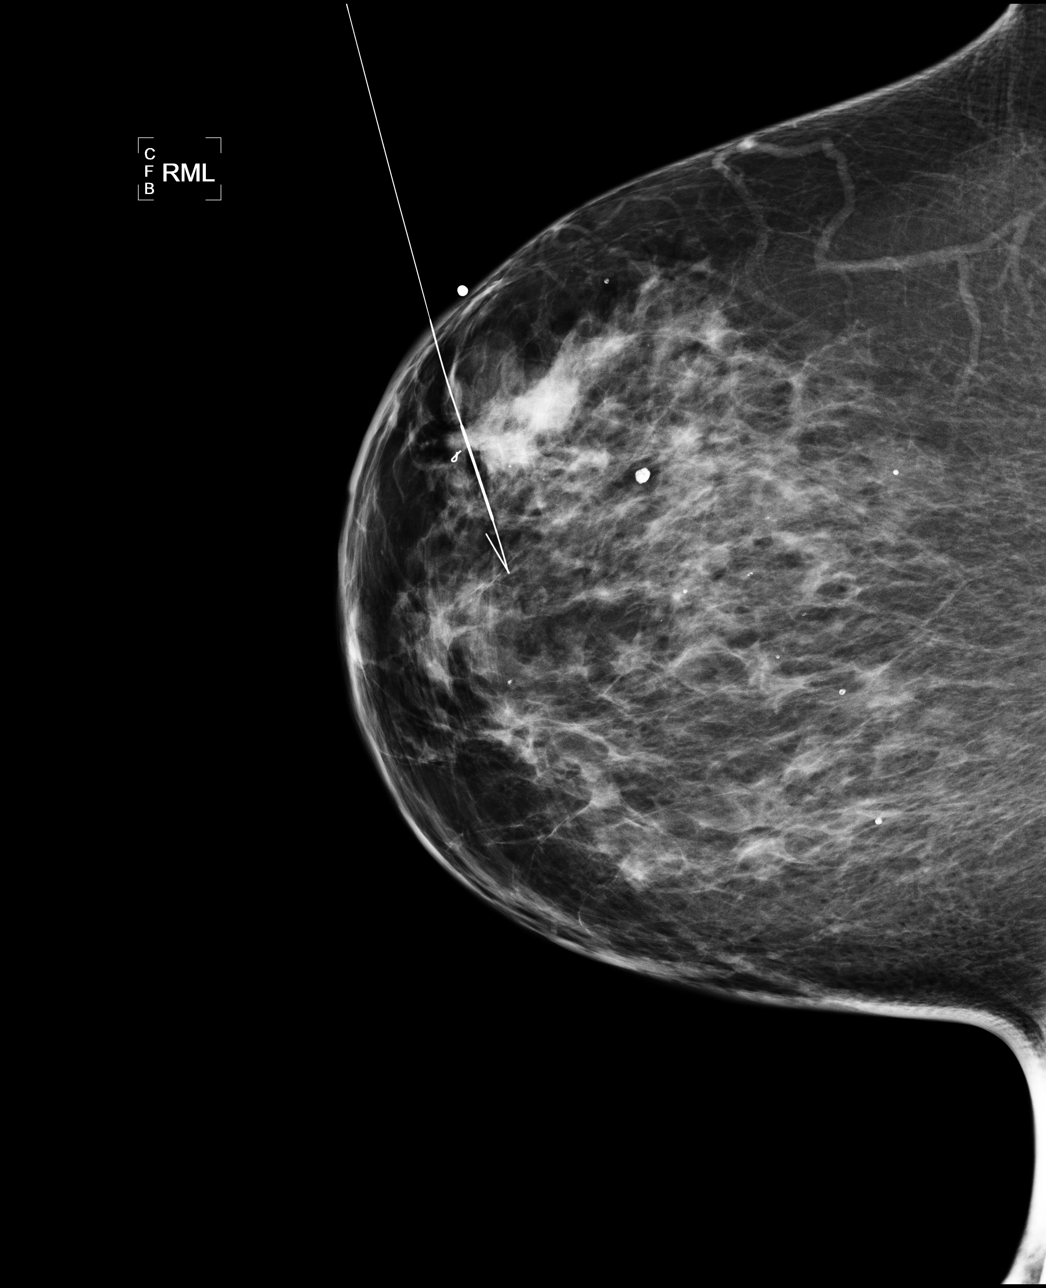

[4 of 4 positions shown; findings below may reference images not displayed]

Patient presents for needle localization prior to lumpectomy.  I
met with the patient and we discussed the procedure of needle
localization including benefits and alternatives. We discussed the
high likelihood of a successful procedure. We discussed the risks
of the procedure, including infection, bleeding, tissue injury, and
further surgery. Informed, written consent was given.

Using mammographic guidance, sterile technique, 2% lidocaine and a
5 cm modified Kopans needle, the mass and ribbon shaped clip were
localized using a superior to inferior approach.  The films are
marked for Dr. Sissy.

Specimen radiograph was performed [facility], and confirms the
intact hook wire, ribbon shaped clip, and mass are present in the
tissue sample.  The specimen is marked for pathology.
IMPRESSION: Needle localization right breast.  No apparent complications.

## 2013-08-11 ENCOUNTER — Other Ambulatory Visit: Payer: Self-pay | Admitting: *Deleted

## 2013-08-11 ENCOUNTER — Ambulatory Visit
Admission: RE | Admit: 2013-08-11 | Discharge: 2013-08-11 | Disposition: A | Discharge status: Home / Self Care | Payer: BC Managed Care – PPO | Source: Ambulatory Visit | Attending: Oncology | Admitting: Oncology

## 2013-08-11 ENCOUNTER — Ambulatory Visit (HOSPITAL_COMMUNITY)
Admission: RE | Admit: 2013-08-11 | Discharge: 2013-08-11 | Disposition: A | Discharge status: Home / Self Care | Payer: BC Managed Care – PPO | Source: Ambulatory Visit | Attending: Oncology | Admitting: Oncology

## 2013-08-11 ENCOUNTER — Encounter (HOSPITAL_COMMUNITY): Payer: Self-pay

## 2013-08-11 DIAGNOSIS — C50411 Malignant neoplasm of upper-outer quadrant of right female breast: Secondary | ICD-10-CM

## 2013-08-11 DIAGNOSIS — K7689 Other specified diseases of liver: Secondary | ICD-10-CM | POA: Insufficient documentation

## 2013-08-11 DIAGNOSIS — C50919 Malignant neoplasm of unspecified site of unspecified female breast: Secondary | ICD-10-CM | POA: Insufficient documentation

## 2013-08-11 DIAGNOSIS — R05 Cough: Secondary | ICD-10-CM | POA: Insufficient documentation

## 2013-08-11 DIAGNOSIS — R059 Cough, unspecified: Secondary | ICD-10-CM | POA: Insufficient documentation

## 2013-08-11 DIAGNOSIS — R1011 Right upper quadrant pain: Secondary | ICD-10-CM | POA: Insufficient documentation

## 2013-08-11 MED ORDER — IOHEXOL 300 MG/ML  SOLN
80.0000 mL | Freq: Once | INTRAMUSCULAR | Status: AC | PRN
  Administered 2013-08-11: 80 mL via INTRAVENOUS

## 2013-08-12 ENCOUNTER — Telehealth: Payer: Self-pay | Admitting: *Deleted

## 2013-08-12 ENCOUNTER — Ambulatory Visit: Payer: BC Managed Care – PPO | Admitting: Family

## 2013-08-12 ENCOUNTER — Encounter: Payer: Self-pay | Admitting: Family

## 2013-08-12 NOTE — Telephone Encounter (Signed)
This RN spoke with pt per her call regarding CT results.  This RN per MD review informed no evidence of cancer- cough is probable related to lisinopril.  Pt stated concern that CT was of chest only " how do we know if the cancer has not gone to my stomach and my pelvis"  Linda Boyer stated concerns due to her history of breast cancer " and I have had 2 brothers die from cancer ", " and I got knots in my stomach ".  This RN discussed with pt above her concerns with verbal support relating to her anxiety. This RN explained usual presentation of breast cancer recurrence and per her complaints at MD visit appropriate studies were done.  Pt did not show MD " knots in my stomach, because I thought he said he was going to do scans from my neck to the top of my knees ".  This RN inquired further per above with pt not able to give good description " just I can feel them every now and then, they are not always there ".  Pt also stated concern due to history of breast cancer " and it can cause uterine cancer so why didn't we check that out ".  This RN explained to pt breast cancer does not cause uterine cancer , nor does pt current therapy with anastrazole relate to uterine cancer.  This RN again validated pt's anxiety regarding her concern with metastatic spread of her history with breast cancer including support that she does not have active cancer and is doing the best known therapy for the cancer to ever recur.  Per concerns with " stomach " issues plan is to proceed to GI consult- pt is requesting a GI MD in the [place] area.  Pt will be referred to [facility] for consult post discussion with pt.

## 2013-08-17 ENCOUNTER — Telehealth: Payer: Self-pay | Admitting: *Deleted

## 2013-08-17 NOTE — Telephone Encounter (Signed)
Patient called and left voicemail that she has not rec'd CT scan that was mailed last week and would like to take a copy to her primary MD and wants to give [place] a fax number. Attempted several times to call patient back, no answer or machine. I have forwarded this information to her primary MD, Dr Ephriam Knuckles, who is listed as her current PCP.

## 2014-01-17 ENCOUNTER — Encounter (INDEPENDENT_AMBULATORY_CARE_PROVIDER_SITE_OTHER): Payer: Self-pay | Admitting: Surgery

## 2014-02-17 ENCOUNTER — Other Ambulatory Visit: Payer: Self-pay | Admitting: Oncology

## 2014-02-17 ENCOUNTER — Other Ambulatory Visit: Payer: Self-pay | Admitting: *Deleted

## 2014-02-17 DIAGNOSIS — Z853 Personal history of malignant neoplasm of breast: Secondary | ICD-10-CM

## 2014-02-17 DIAGNOSIS — Z9889 Other specified postprocedural states: Secondary | ICD-10-CM

## 2014-02-20 ENCOUNTER — Other Ambulatory Visit: Payer: Self-pay | Admitting: Oncology

## 2014-02-20 ENCOUNTER — Other Ambulatory Visit: Payer: Self-pay

## 2014-02-20 DIAGNOSIS — Z9889 Other specified postprocedural states: Secondary | ICD-10-CM

## 2014-02-20 DIAGNOSIS — Z853 Personal history of malignant neoplasm of breast: Secondary | ICD-10-CM

## 2014-03-03 ENCOUNTER — Other Ambulatory Visit: Payer: Self-pay | Admitting: *Deleted

## 2014-03-03 DIAGNOSIS — C50411 Malignant neoplasm of upper-outer quadrant of right female breast: Secondary | ICD-10-CM

## 2014-03-07 ENCOUNTER — Telehealth: Payer: Self-pay | Admitting: Oncology

## 2014-03-07 ENCOUNTER — Other Ambulatory Visit (HOSPITAL_BASED_OUTPATIENT_CLINIC_OR_DEPARTMENT_OTHER): Payer: BC Managed Care – PPO

## 2014-03-07 ENCOUNTER — Ambulatory Visit (INDEPENDENT_AMBULATORY_CARE_PROVIDER_SITE_OTHER): Payer: BC Managed Care – PPO | Admitting: Surgery

## 2014-03-07 ENCOUNTER — Other Ambulatory Visit: Payer: Self-pay | Admitting: *Deleted

## 2014-03-07 ENCOUNTER — Ambulatory Visit (HOSPITAL_BASED_OUTPATIENT_CLINIC_OR_DEPARTMENT_OTHER): Payer: BC Managed Care – PPO | Admitting: Oncology

## 2014-03-07 ENCOUNTER — Encounter (INDEPENDENT_AMBULATORY_CARE_PROVIDER_SITE_OTHER): Payer: Self-pay | Admitting: Surgery

## 2014-03-07 ENCOUNTER — Ambulatory Visit
Admission: RE | Admit: 2014-03-07 | Discharge: 2014-03-07 | Disposition: A | Discharge status: Home / Self Care | Payer: BC Managed Care – PPO | Source: Ambulatory Visit | Attending: Oncology | Admitting: Oncology

## 2014-03-07 VITALS — BP 163/82 | HR 73 | Temp 98.4°F | Resp 20 | Ht 65.0 in | Wt 238.9 lb

## 2014-03-07 VITALS — BP 146/81 | HR 72 | Temp 98.6°F | Resp 18 | Ht 65.0 in | Wt 239.2 lb

## 2014-03-07 DIAGNOSIS — C50419 Malignant neoplasm of upper-outer quadrant of unspecified female breast: Secondary | ICD-10-CM

## 2014-03-07 DIAGNOSIS — C50411 Malignant neoplasm of upper-outer quadrant of right female breast: Secondary | ICD-10-CM

## 2014-03-07 DIAGNOSIS — Z853 Personal history of malignant neoplasm of breast: Secondary | ICD-10-CM

## 2014-03-07 DIAGNOSIS — E119 Type 2 diabetes mellitus without complications: Secondary | ICD-10-CM

## 2014-03-07 DIAGNOSIS — Z9889 Other specified postprocedural states: Secondary | ICD-10-CM

## 2014-03-07 DIAGNOSIS — Z170 Estrogen receptor positive status [ER+]: Secondary | ICD-10-CM

## 2014-03-07 DIAGNOSIS — Z780 Asymptomatic menopausal state: Secondary | ICD-10-CM

## 2014-03-07 LAB — CBC WITH DIFFERENTIAL/PLATELET
BASO%: 0.7 % (ref 0.0–2.0)
BASOS ABS: 0 10*3/uL (ref 0.0–0.1)
EOS%: 2.7 % (ref 0.0–7.0)
Eosinophils Absolute: 0.1 10*3/uL (ref 0.0–0.5)
HCT: 41.5 % (ref 34.8–46.6)
HEMOGLOBIN: 13.8 g/dL (ref 11.6–15.9)
LYMPH#: 1.4 10*3/uL (ref 0.9–3.3)
LYMPH%: 30.2 % (ref 14.0–49.7)
MCH: 32.1 pg (ref 25.1–34.0)
MCHC: 33.3 g/dL (ref 31.5–36.0)
MCV: 96.4 fL (ref 79.5–101.0)
MONO#: 0.4 10*3/uL (ref 0.1–0.9)
MONO%: 8.3 % (ref 0.0–14.0)
NEUT#: 2.6 10*3/uL (ref 1.5–6.5)
NEUT%: 58.1 % (ref 38.4–76.8)
PLATELETS: 188 10*3/uL (ref 145–400)
RBC: 4.3 10*6/uL (ref 3.70–5.45)
RDW: 12.6 % (ref 11.2–14.5)
WBC: 4.5 10*3/uL (ref 3.9–10.3)

## 2014-03-07 LAB — COMPREHENSIVE METABOLIC PANEL (CC13)
ALT: 34 U/L (ref 0–55)
AST: 25 U/L (ref 5–34)
Albumin: 3.6 g/dL (ref 3.5–5.0)
Alkaline Phosphatase: 60 U/L (ref 40–150)
Anion Gap: 10 mEq/L (ref 3–11)
BILIRUBIN TOTAL: 0.52 mg/dL (ref 0.20–1.20)
BUN: 9.6 mg/dL (ref 7.0–26.0)
CALCIUM: 9.7 mg/dL (ref 8.4–10.4)
CHLORIDE: 103 meq/L (ref 98–109)
CO2: 25 meq/L (ref 22–29)
CREATININE: 0.8 mg/dL (ref 0.6–1.1)
GLUCOSE: 267 mg/dL — AB (ref 70–140)
Potassium: 4.3 mEq/L (ref 3.5–5.1)
Sodium: 138 mEq/L (ref 136–145)
Total Protein: 6.7 g/dL (ref 6.4–8.3)

## 2014-03-07 MED ORDER — ANASTROZOLE 1 MG PO TABS: 1.0000 mg | ORAL_TABLET | Freq: Every day | ORAL | Status: DC

## 2014-03-07 NOTE — Progress Notes (Signed)
Subjective:     Patient ID: Linda Boyer, female   DOB: 1951/05/22, 63 y.o.   MRN: [id number]  HPI She is here for long-term followup regarding her right breast cancer. She is over one year out status post right breast lumpectomy and sentinel lymph node biopsy. She is currently on anti-hormonal therapy. She has no drainage from her nipple and no complaints  Review of Systems     Objective:   Physical Exam On exam, the right breast is soft. There is no palpable mass and no right axillary adenopathy. Her most recent mammograms show only postoperative changes    Assessment:     Patient stable with a history of right breast cancer     Plan:     She will continue her current care and I will see her back in one year

## 2014-03-07 NOTE — Progress Notes (Signed)
ID: Linda Boyer   DOB: [birth year]  MR#: [id number]  TDV#:[id number]  PCP: Verdis Prime GYN:  SU: Coralie Keens OTHER MD: Arloa Koh  CC: Early stage breast cancer TREATMENT: Receiving aromatase inhibitor therapy   BREAST CANCERHISTORY: Ms. Hilgeman originally presented with discomfort in the lateral aspect of the left breast. She was referred for mammography to the breast Center 10/30/2011, and this showed dense breast tissue bilaterally but no suspicious finding in the left breast. There was no palpable abnormality. Ultrasound of the left breast was negative   On repeat diagnostic mammography a year later however, 11/01/2012, a spiculated mass was found in the upper right breast. There were a few associated punctate calcifications. An area of focal thickening was palpable and ultrasound confirmed a 1.2 cm irregular hypoechoic mass at the 12:00 position of the right breast 3 cm from the nipple. There was no abnormal adenopathy. The rest of the right breast and the left breast were unremarkable.  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 The MIB-1-1 was 16%. There was no HER-2 amplification.  The patient's subsequent history is as detailed below  INTERVAL HISTORY: Linda Boyer returns today for followup of her breast cancer. Interval history is stable. She does some gardening and rides in her husbands truck, which she enjoys because she hates to sleep alone in their home. She is tolerating the anastrozole with no significant side effects other than mild hot flashes.  REVIEW OF SYSTEMS: [name] walks "up and down the stairs" several times a day, since her home office is in the basement. Otherwise she does not exercise regularly. She is not on a special diet. The hot flashes sometimes wake her up at night. Otherwise, a detailed review of systems today was  noncontributory  PAST MEDICAL HISTORY: Past Medical History  Diagnosis Date  . Breast cancer     right side, er 86% +, PR 100%  . Diabetes mellitus without complication   . Hypertension     sees Dr. Verdis Prime [phone]  . Sleep apnea     "has not used cpap in a long time", sleep study over 5 years ago   hypertension  PAST SURGICAL HISTORY: Past Surgical History  Procedure Laterality Date  . Colonoscopy    . Breast lumpectomy with needle localization and axillary sentinel lymph node bx Right 12/10/2012    Procedure: BREAST LUMPECTOMY WITH NEEDLE LOCALIZATION AND AXILLARY SENTINEL LYMPH NODE BX;  Surgeon: Harl Bowie, MD;  Location: [facility];  Service: General;  Laterality: Right;  . Breast surgery      FAMILY HISTORY No family history on file. The patient's father died from a myocardial infarction at age 24. The patient's mother died from renal failure at age 73. The patient had 2 half brothers. Both died from lung cancer in the setting of tobacco abuse. The patient has 4 sisters. There is no history of breast or ovarian cancer in the family.  GYNECOLOGIC HISTORY: Menarche age 63, first live birth age 16, she is [place] P1. Menopause 2009. She never took hormone replacement.  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 Her husband Clair Gulling is a Administrator, and she makes calls to arrange for his loads. She also accompanies him on some of his long rides, which means she is away from home are quite a bit. Their son  Corene Cornea lives in [place]. [place] where he works as a Dealer. The patient has 1 grandchild, currently 15 years old. She is not a Ambulance person.  ADVANCED DIRECTIVES: Not in place  HEALTH MAINTENANCE: History  Substance Use Topics  . Smoking status: Never Smoker   . Smokeless tobacco: Not on file  . Alcohol Use: No     Colonoscopy: 2013  PAP: 2012  Bone density: 208/13/2014 / normal ([facility])  Lipid panel:  No Known Allergies  Current Outpatient Prescriptions  Medication Sig Dispense Refill  . anastrozole (ARIMIDEX) 1 MG tablet Take 1 tablet (1 mg total) by mouth daily.  30 tablet  12  . fish oil-omega-3 fatty acids 1000 MG capsule Take 1 g by mouth 2 (two) times daily.       Marland Kitchen glipiZIDE (GLUCOTROL) 10 MG tablet Take 20 mg by mouth 2 (two) times daily before a meal.      . hyaluronate sodium (RADIAPLEXRX) GEL Apply 1 application topically 2 (two) times daily. 2nd tube given      . lisinopril (PRINIVIL,ZESTRIL) 20 MG tablet Take 20 mg by mouth daily.      Marland Kitchen LORazepam (ATIVAN) 0.5 MG tablet 0.5 mg as needed.       Marland Kitchen losartan (COZAAR) 50 MG tablet Take 1 tablet (50 mg total) by mouth daily.  30 tablet  12  . metFORMIN (GLUCOPHAGE) 500 MG tablet Take 1,000 mg by mouth 2 (two) times daily with a meal.      . Multiple Vitamin (MULTIVITAMIN WITH MINERALS) TABS Take 1 tablet by mouth daily.      . traZODone (DESYREL) 50 MG tablet Take 1 tablet (50 mg total) by mouth at bedtime.  30 tablet  12  . vitamin E 1000 UNIT capsule Take 1,000 Units by mouth daily.       No current facility-administered medications for this visit.      Objective: Middle-aged white woman in no acute distress Filed Vitals:   03/07/14 1259  BP: 163/82  Pulse: 73  Temp: 98.4 F (36.9 C)  Resp: 20     Body mass index is 39.75 kg/(m^2).    ECOG FS: 1 Filed Weights   03/07/14 1259  Weight: 238 lb 14.4 oz (108.364 kg)   Sclerae unicteric, pupils round and equal Oropharynx clear and moist No cervical or supraclavicular adenopathy Lungs no rales or rhonchi Heart regular rate and rhythm Abd soft, obese, nontender, positive bowel sounds MSK no focal spinal tenderness, no upper extremity lymphedema Neuro: nonfocal, well oriented, anxious affect Breasts: The right breast is status post lumpectomy and radiation. There is no evidence of local recurrence. The right axilla is benign Left breast is  unremarkable  LAB RESULTS: Lab Results  Component Value Date   WBC 4.5 03/07/2014   NEUTROABS 2.6 03/07/2014   HGB 13.8 03/07/2014   HCT 41.5 03/07/2014   MCV 96.4 03/07/2014   PLT 188 03/07/2014      Chemistry      Component Value Date/Time   NA 138 03/07/2014 1209   NA 134* 12/03/2012 1404   K 4.3 03/07/2014 1209   K 4.1 12/03/2012 1404   CL 93* 12/03/2012 1404   CL 96* 11/17/2012 1223   CO2 25 03/07/2014 1209   CO2 29 12/03/2012 1404   BUN 9.6 03/07/2014 1209   BUN 12 12/03/2012 1404   CREATININE 0.8 03/07/2014 1209   CREATININE 0.65 12/03/2012 1404      Component Value Date/Time   CALCIUM  9.7 03/07/2014 1209   CALCIUM 10.8* 12/03/2012 1404   ALKPHOS 60 03/07/2014 1209   AST 25 03/07/2014 1209   ALT 34 03/07/2014 1209   BILITOT 0.52 03/07/2014 1209       STUDIES:  EXAM:  DIGITAL DIAGNOSTIC left MAMMOGRAM  With CAD  COMPARISON: Prior exam October 30, 2011 and 11/01/2012  ACR Breast Density Category b: There are scattered areas of  fibroglandular density.  FINDINGS:  Oval circumscribed left breast masses are stable. No new suspicious  finding is seen in the left breast.  IMPRESSION:  No evidence for malignancy in the left breast.  RECOMMENDATION:  Bilateral diagnostic mammography January 2015  I have discussed the findings and recommendations with the patient.  Results were also provided in writing at the conclusion of the  visit. If applicable, a reminder letter will be sent to the patient  regarding the next appointment.  BI-RADS CATEGORY 2: Benign Finding(s)  Electronically Signed  By: Conchita Paris M.D.  On: 08/11/2013 10:32         ASSESSMENT: 62 y.o. [place], [place] woman   (1)  status post right breast lumpectomy and sentinel lymph node sampling 12/10/2012 for a pT1b pN0, stage IA invasive ductal carcinoma, grade 1, estrogen receptor 86% and progesterone receptor 100% positive, with an MIB-1 of 16% and no HER-2 amplification   (2) Oncotype DX score of 17 predicts a  distant recurrence rate of 11% within 10 years if the patient's only systemic treatment is tamoxifen for 5 years.  (3)  status post radiation therapy, completed 03/11/2013  (4)  started on anastrozole, 1 mg daily, 03/13/2013; bone density 05/25/2013 was normal    PLAN:  Babita is doing fine from a breast cancer point of view. The plan is going to be to continue anastrozole through June of 2019. She is tolerating that well, and she will need a repeat bone density August of next year.  She would greatly benefit from exercising regularly and I gave him the [employer] pamphlet today so she can locate a Y[facility] near her house that participates. This would allow her to start an exercise program without injuring herself. And hopefully she can bring her sugar back a little bit. We also discussed diet issues.  1 the has a good understanding of the overall plan. She agrees with its. She knows a goal of treatment in her cases cure. She will call with any problems that may develop before the next visit here.  Virgie Dad Magrinat    03/07/2014

## 2014-03-08 ENCOUNTER — Other Ambulatory Visit: Payer: Self-pay | Admitting: *Deleted

## 2014-03-08 MED ORDER — ANASTROZOLE 1 MG PO TABS: 1.0000 mg | ORAL_TABLET | Freq: Every day | ORAL | Status: DC

## 2014-04-09 IMAGING — CT CT CHEST W/ CM
1 of 2 series · 14 of 30 positions shown, 18 images · IV contrast (OMNIPAQUE)
Comparison: None.

CLINICAL DATA: Followup breast carcinoma. Worsening cough and right
upper quadrant pain.

EXAM:
CT CHEST WITH CONTRAST
TECHNIQUE: Multidetector CT imaging of the chest was performed during
intravenous contrast administration.
CONTRAST:  80mL OMNIPAQUE IOHEXOL 300 MG/ML  SOLN

[Series 2: rtn chest with st · axial · 0.82mm/px · z∈[-279,-34]mm · 14 of 59 slices shown, 18 images]
[im 5/59  mediastinal]
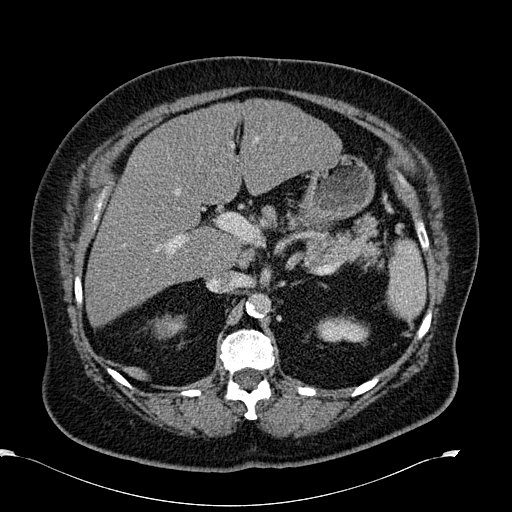
[im 5/59  lung]
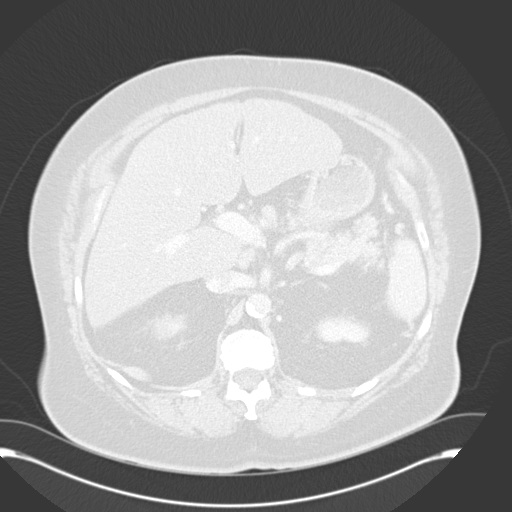
[im 9/59  lung]
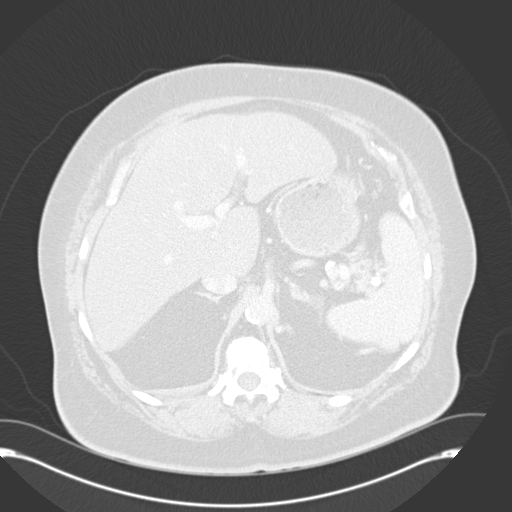
[im 13/59  lung]
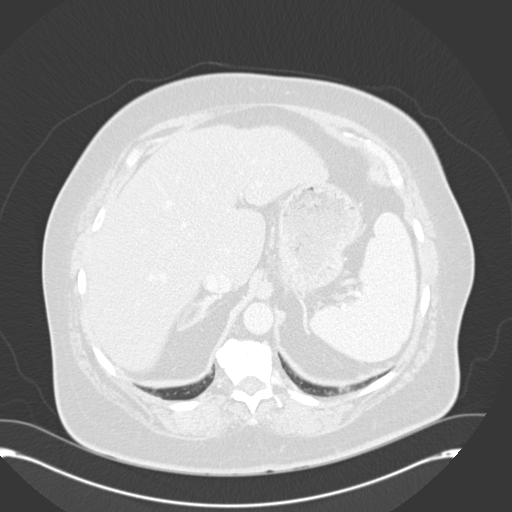
[im 17/59  lung]
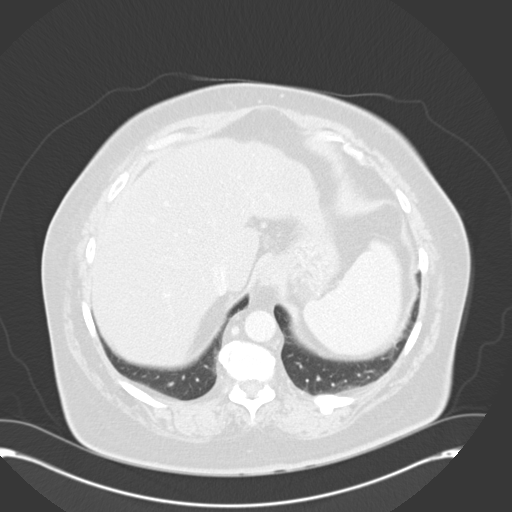
[im 21/59  mediastinal]
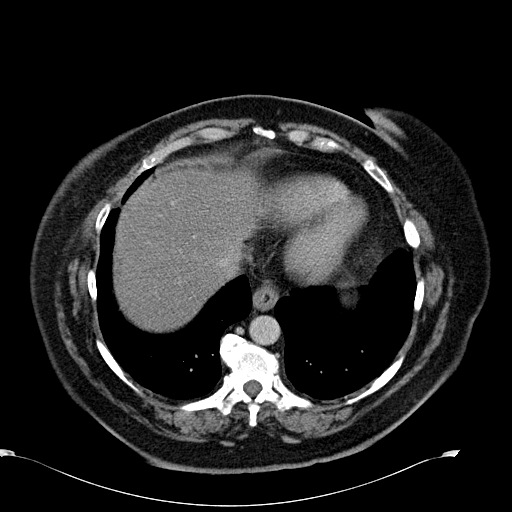
[im 21/59  lung]
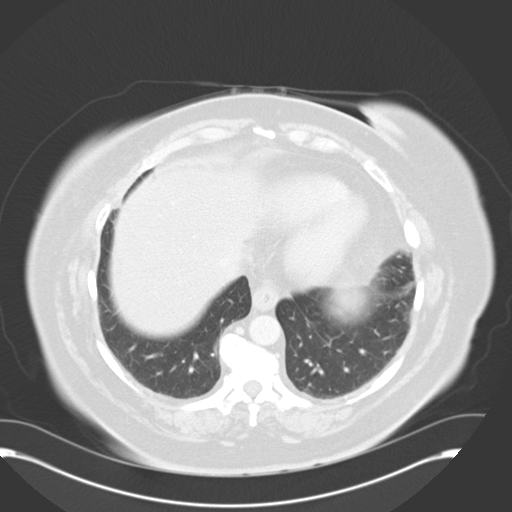
[im 25/59  lung]
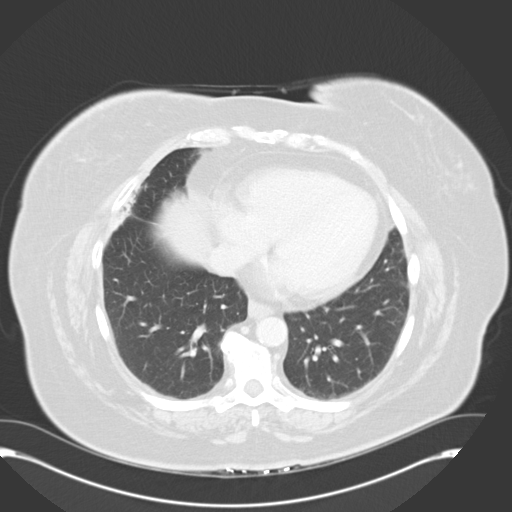
[im 28/59  lung]
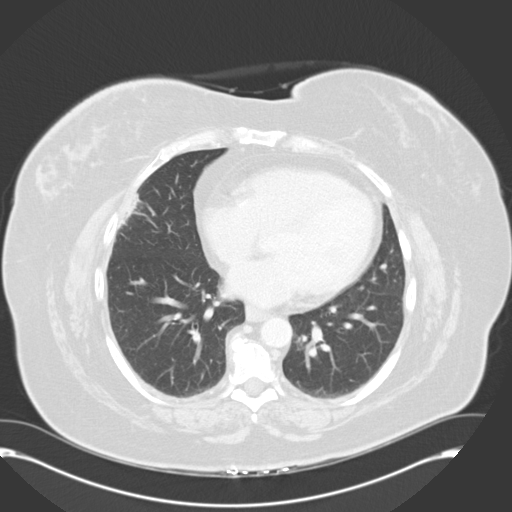
[im 30/59  lung]
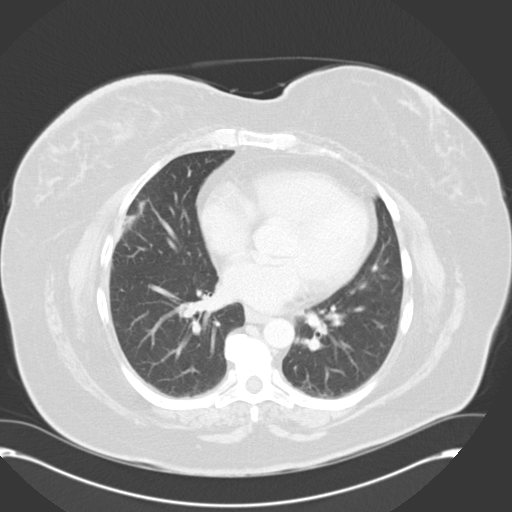
[im 34/59  mediastinal]
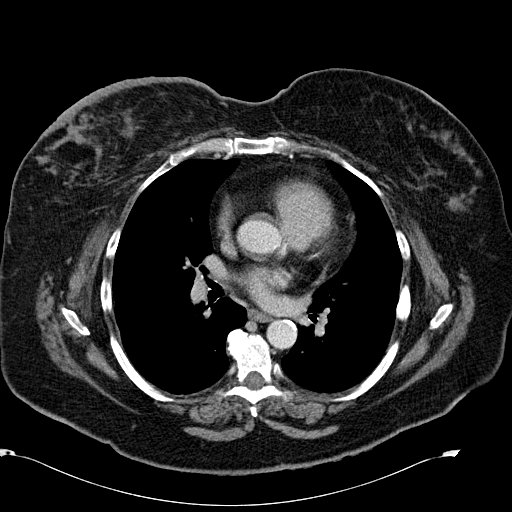
[im 34/59  lung]
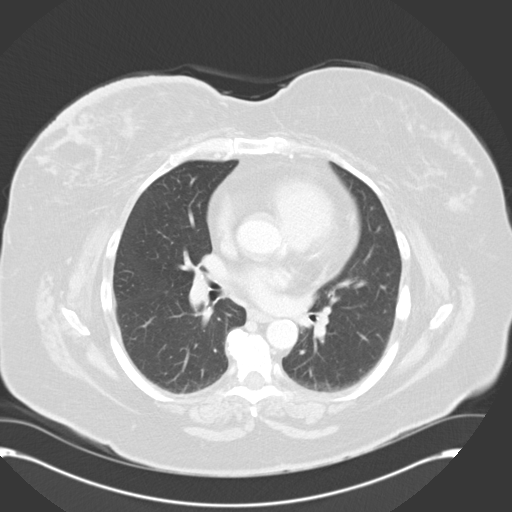
[im 38/59  lung]
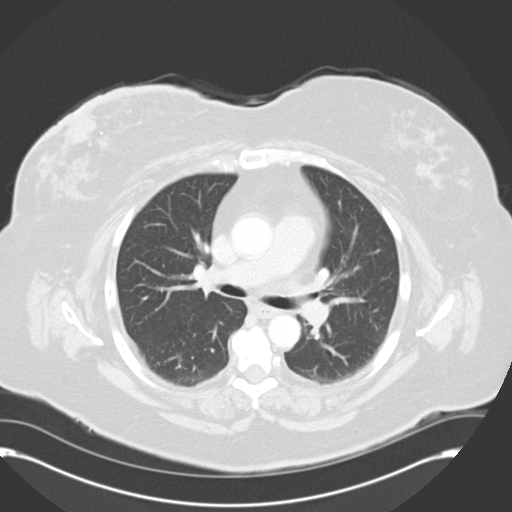
[im 42/59  lung]
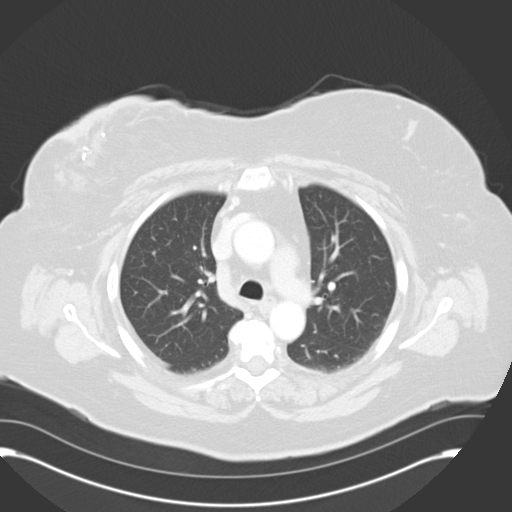
[im 46/59  lung]
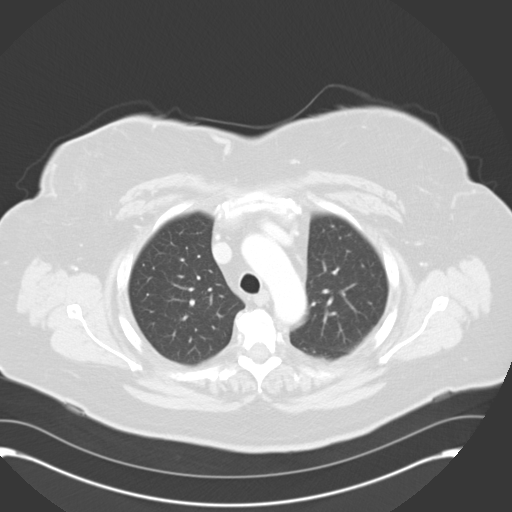
[im 50/59  mediastinal]
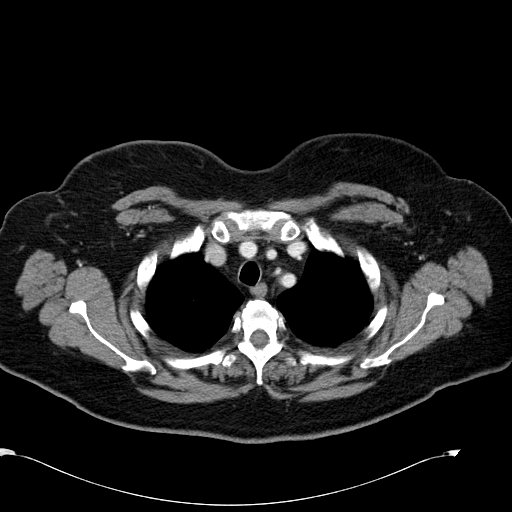
[im 50/59  lung]
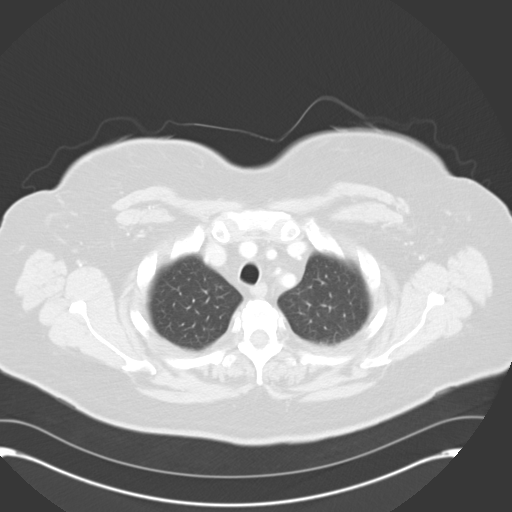
[im 54/59  lung]
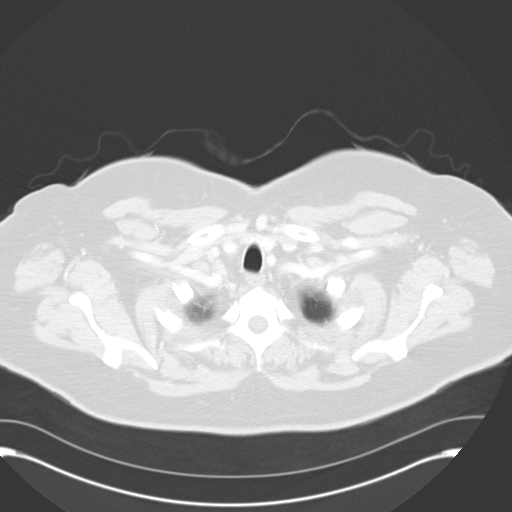

[14 of 30 positions shown; findings below may reference images not displayed]

FINDINGS: Post lumpectomy changes are noted in the right breast. No evidence
of axillary lymphadenopathy. There is no evidence of mediastinal or
hilar lymphadenopathy.

Mild scarring noted in the peripheral right middle lobe, however
there is no evidence of acute infiltrate or central endobronchial
obstruction. No suspicious pulmonary nodules or masses are
identified. No evidence of chest wall mass or suspicious bone
lesions. An old healed left posterior rib fracture deformity is
noted.

Both adrenal glands are normal in appearance. Hepatic steatosis
incidentally noted.
IMPRESSION: No evidence of metastatic disease or other acute findings within the
thorax.

Hepatic steatosis incidentally noted.

## 2014-07-26 ENCOUNTER — Other Ambulatory Visit: Payer: Self-pay | Admitting: Oncology

## 2014-07-26 DIAGNOSIS — Z853 Personal history of malignant neoplasm of breast: Secondary | ICD-10-CM

## 2014-08-14 ENCOUNTER — Encounter (INDEPENDENT_AMBULATORY_CARE_PROVIDER_SITE_OTHER): Payer: Self-pay | Admitting: Surgery

## 2014-08-22 ENCOUNTER — Telehealth: Payer: Self-pay | Admitting: Oncology

## 2014-08-22 NOTE — Telephone Encounter (Signed)
, °

## 2014-09-12 ENCOUNTER — Ambulatory Visit: Payer: BC Managed Care – PPO | Admitting: Adult Health

## 2014-09-12 ENCOUNTER — Other Ambulatory Visit: Payer: BC Managed Care – PPO

## 2014-10-16 ENCOUNTER — Telehealth: Payer: Self-pay | Admitting: Oncology

## 2014-10-16 NOTE — Telephone Encounter (Signed)
, °

## 2014-10-17 ENCOUNTER — Ambulatory Visit: Payer: BC Managed Care – PPO | Admitting: Oncology

## 2014-10-17 ENCOUNTER — Other Ambulatory Visit: Payer: BC Managed Care – PPO

## 2014-10-18 NOTE — Progress Notes (Signed)
No show

## 2014-10-19 ENCOUNTER — Telehealth: Payer: Self-pay | Admitting: Oncology

## 2014-10-19 NOTE — Telephone Encounter (Signed)
s/w pt re appt new appt for 12/18/14. r/s from 1/5 per pt.

## 2014-12-05 ENCOUNTER — Telehealth: Payer: Self-pay | Admitting: Oncology

## 2014-12-05 NOTE — Telephone Encounter (Signed)
pt cld wanting to know next appt-cld & recording states no vm set up yet-couldnt leave a message of appt-mailed copy of sch

## 2014-12-18 ENCOUNTER — Other Ambulatory Visit (HOSPITAL_BASED_OUTPATIENT_CLINIC_OR_DEPARTMENT_OTHER): Payer: BLUE CROSS/BLUE SHIELD

## 2014-12-18 ENCOUNTER — Ambulatory Visit (HOSPITAL_BASED_OUTPATIENT_CLINIC_OR_DEPARTMENT_OTHER): Payer: BLUE CROSS/BLUE SHIELD | Admitting: Oncology

## 2014-12-18 ENCOUNTER — Telehealth: Payer: Self-pay | Admitting: Oncology

## 2014-12-18 ENCOUNTER — Other Ambulatory Visit: Payer: BLUE CROSS/BLUE SHIELD

## 2014-12-18 VITALS — BP 146/68 | HR 87 | Temp 97.5°F | Resp 18 | Ht 65.0 in | Wt 231.4 lb

## 2014-12-18 DIAGNOSIS — Z780 Asymptomatic menopausal state: Secondary | ICD-10-CM

## 2014-12-18 DIAGNOSIS — C50811 Malignant neoplasm of overlapping sites of right female breast: Secondary | ICD-10-CM

## 2014-12-18 DIAGNOSIS — Z170 Estrogen receptor positive status [ER+]: Secondary | ICD-10-CM

## 2014-12-18 DIAGNOSIS — B379 Candidiasis, unspecified: Secondary | ICD-10-CM

## 2014-12-18 DIAGNOSIS — E119 Type 2 diabetes mellitus without complications: Secondary | ICD-10-CM

## 2014-12-18 DIAGNOSIS — C50411 Malignant neoplasm of upper-outer quadrant of right female breast: Secondary | ICD-10-CM

## 2014-12-18 LAB — COMPREHENSIVE METABOLIC PANEL (CC13)
ALT: 22 U/L (ref 0–55)
ANION GAP: 12 meq/L — AB (ref 3–11)
AST: 22 U/L (ref 5–34)
Albumin: 3.8 g/dL (ref 3.5–5.0)
Alkaline Phosphatase: 58 U/L (ref 40–150)
BUN: 13.4 mg/dL (ref 7.0–26.0)
CALCIUM: 10.5 mg/dL — AB (ref 8.4–10.4)
CHLORIDE: 100 meq/L (ref 98–109)
CO2: 27 meq/L (ref 22–29)
Creatinine: 0.8 mg/dL (ref 0.6–1.1)
EGFR: 83 mL/min/{1.73_m2} — ABNORMAL LOW (ref >90)
Glucose: 128 mg/dl (ref 70–140)
POTASSIUM: 3.7 meq/L (ref 3.5–5.1)
Sodium: 139 mEq/L (ref 136–145)
Total Bilirubin: 0.73 mg/dL (ref 0.20–1.20)
Total Protein: 7.1 g/dL (ref 6.4–8.3)

## 2014-12-18 LAB — CBC WITH DIFFERENTIAL/PLATELET
BASO%: 0.4 % (ref 0.0–2.0)
BASOS ABS: 0 10*3/uL (ref 0.0–0.1)
EOS%: 1.6 % (ref 0.0–7.0)
Eosinophils Absolute: 0.1 10*3/uL (ref 0.0–0.5)
HEMATOCRIT: 40.3 % (ref 34.8–46.6)
HEMOGLOBIN: 14 g/dL (ref 11.6–15.9)
LYMPH#: 1.9 10*3/uL (ref 0.9–3.3)
LYMPH%: 33.3 % (ref 14.0–49.7)
MCH: 31.1 pg (ref 25.1–34.0)
MCHC: 34.7 g/dL (ref 31.5–36.0)
MCV: 89.6 fL (ref 79.5–101.0)
MONO#: 0.6 10*3/uL (ref 0.1–0.9)
MONO%: 10.3 % (ref 0.0–14.0)
NEUT#: 3.1 10*3/uL (ref 1.5–6.5)
NEUT%: 54.4 % (ref 38.4–76.8)
Platelets: 180 10*3/uL (ref 145–400)
RBC: 4.5 10*6/uL (ref 3.70–5.45)
RDW: 13.2 % (ref 11.2–14.5)
WBC: 5.7 10*3/uL (ref 3.9–10.3)
nRBC: 0 % (ref 0–0)

## 2014-12-18 MED ORDER — MICONAZOLE NITRATE 2 % EX CREA: 1.0000 "application " | TOPICAL_CREAM | Freq: Two times a day (BID) | CUTANEOUS | Status: DC

## 2014-12-18 NOTE — Progress Notes (Signed)
ID: Linda Boyer   DOB: 02/21/1951  MR#: [id number]  HQI#:[id number]  PCP: Verdis Prime GYN:  SU: Coralie Keens OTHER MD: Arloa Koh  CC: Early stage estrogen receptor positive breast cancer  TREATMENT: Anastrozole   BREAST CANCERHISTORY: From the original intake note:  Ms. Capri originally presented with discomfort in the lateral aspect of the left breast. She was referred for mammography to the breast Center 10/30/2011, and this showed dense breast tissue bilaterally but no suspicious finding in the left breast. There was no palpable abnormality. Ultrasound of the left breast was negative   On repeat diagnostic mammography a year later however, 11/01/2012, a spiculated mass was found in the upper right breast. There were a few associated punctate calcifications. An area of focal thickening was palpable and ultrasound confirmed a 1.2 cm irregular hypoechoic mass at the 12:00 position of the right breast 3 cm from the nipple. There was no abnormal adenopathy. The rest of the right breast and the left breast were unremarkable.  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 The MIB-1-1 was 16%. There was no HER-2 amplification.  The patient's subsequent history is as detailed below  INTERVAL HISTORY: [name] returns today for followup of her breast cancer. She continues on anastrozole, which she is tolerating generally quite well. She did have some hot flashes initially but those have subsided. Vaginal dryness is not an issue for her. She has not noted the arthralgias and myalgias some patients can experience on this medication. She obtains it for about a dollars a month.   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 They were only in town 2 days last month. They do not stop to exercise but "he Romilda Garret is far away as he can" when they do  make a stop. They usually sleep in the truck except when there is a [place] in the date and and a rent a motel so they can enjoy that. A detailed review of systems today was otherwise noncontributory  PAST MEDICAL HISTORY: Past Medical History  Diagnosis Date  . Breast cancer     right side, er 86% +, PR 100%  . Diabetes mellitus without complication   . Hypertension     sees Dr. Verdis Prime [phone]  . Sleep apnea     "has not used cpap in a long time", sleep study over 5 years ago   hypertension  PAST SURGICAL HISTORY: Past Surgical History  Procedure Laterality Date  . Colonoscopy    . Breast lumpectomy with needle localization and axillary sentinel lymph node bx Right 12/10/2012    Procedure: BREAST LUMPECTOMY WITH NEEDLE LOCALIZATION AND AXILLARY SENTINEL LYMPH NODE BX;  Surgeon: Harl Bowie, MD;  Location: [facility];  Service: General;  Laterality: Right;  . Breast surgery      FAMILY HISTORY No family history on file. The patient's father died from a myocardial infarction at age 20. The patient's mother died from renal failure at age 21. The patient had 2 half brothers. Both died from lung cancer in the setting of tobacco abuse. The patient has 4 sisters. There is no history of breast or ovarian cancer in the family.  GYNECOLOGIC HISTORY: Menarche age 64, first live birth age 64, she is [place] P1. Menopause 2009. She never took hormone replacement.  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 Her husband Clair Gulling is a Administrator, and she makes calls to arrange for his loads. She also accompanies him on some of his long rides, which means she is away from home are quite a bit. Their son Corene Cornea lives in [place] where he works as a Dealer. The patient has 1 grandchild, currently 65 years old. She is not a Ambulance person.  ADVANCED DIRECTIVES: Not in place  HEALTH MAINTENANCE: History  Substance Use  Topics  . Smoking status: Never Smoker   . Smokeless tobacco: Not on file  . Alcohol Use: No     Colonoscopy: 2013  PAP: 2012  Bone density: 208/13/2014 / normal ([facility])  Lipid panel:  No Known Allergies  Current Outpatient Prescriptions  Medication Sig Dispense Refill  . anastrozole (ARIMIDEX) 1 MG tablet Take 1 tablet (1 mg total) by mouth daily. 30 tablet 12  . fish oil-omega-3 fatty acids 1000 MG capsule Take 1 g by mouth 2 (two) times daily.     Marland Kitchen glipiZIDE (GLUCOTROL) 10 MG tablet Take 20 mg by mouth 2 (two) times daily before a meal.    . hyaluronate sodium (RADIAPLEXRX) GEL Apply 1 application topically 2 (two) times daily. 2nd tube given    . Lancets (FREESTYLE) lancets     . LANTUS SOLOSTAR 100 UNIT/ML Solostar Pen     . lisinopril (PRINIVIL,ZESTRIL) 20 MG tablet Take 20 mg by mouth daily.    Marland Kitchen LORazepam (ATIVAN) 0.5 MG tablet 0.5 mg as needed.     Marland Kitchen losartan (COZAAR) 50 MG tablet Take 1 tablet (50 mg total) by mouth daily. 30 tablet 12  . metFORMIN (GLUCOPHAGE) 500 MG tablet Take 1,000 mg by mouth 2 (two) times daily with a meal.    . Multiple Vitamin (MULTIVITAMIN WITH MINERALS) TABS Take 1 tablet by mouth daily.    . traZODone (DESYREL) 50 MG tablet Take 1 tablet (50 mg total) by mouth at bedtime. 30 tablet 12  . vitamin E 1000 UNIT capsule Take 1,000 Units by mouth daily.     No current facility-administered medications for this visit.      Objective: Middle-aged white woman who appears stated age  64 Vitals:   12/18/14 1340  BP: 146/68  Pulse:   Temp:   Resp:      Body mass index is 38.51 kg/(m^2).    ECOG FS: 1 Filed Weights   12/18/14 1329  Weight: 231 lb 6.4 oz (104.962 kg)   Sclerae unicteric,EOMs intact  Oropharynx clear, no thrush or other lesions  No cervical or supraclavicular adenopathy Lungs no rales or rhonchi Heart regular rate and rhythm Abd soft, obese, nontender, positive bowel sounds MSK no focal spinal tenderness, no upper  extremity lymphedema Neuro: nonfocal, well oriented,appropriatet Breasts: The right breast is status post lumpectomy and radiation. There is no evidence of local recurrence. The right axilla is benign Left breast is unremarkable  LAB RESULTS: Lab Results  Component Value Date   WBC 5.7 12/18/2014   NEUTROABS 3.1 12/18/2014   HGB 14.0 12/18/2014   HCT 40.3 12/18/2014   MCV 89.6 12/18/2014   PLT 180 12/18/2014      Chemistry      Component Value Date/Time   NA 139 12/18/2014 1306   NA 134* 12/03/2012 1404   K 3.7 12/18/2014 1306   K 4.1 12/03/2012 1404   CL 93* 12/03/2012 1404   CL 96* 11/17/2012 1223   CO2 27 12/18/2014  1306   CO2 29 12/03/2012 1404   BUN 13.4 12/18/2014 1306   BUN 12 12/03/2012 1404   CREATININE 0.8 12/18/2014 1306   CREATININE 0.65 12/03/2012 1404      Component Value Date/Time   CALCIUM 10.5* 12/18/2014 1306   CALCIUM 10.8* 12/03/2012 1404   ALKPHOS 58 12/18/2014 1306   AST 22 12/18/2014 1306   ALT 22 12/18/2014 1306   BILITOT 0.73 12/18/2014 1306       STUDIES:  No results found.   ASSESSMENT: 64 y.o. [place], [place] woman   (1)  status post right breast lumpectomy and sentinel lymph node sampling 12/10/2012 for a pT1b pN0, stage IA invasive ductal carcinoma, grade 1, estrogen receptor 86% and progesterone receptor 100% positive, with an MIB-1 of 16% and no HER-2 amplification   (2) Oncotype DX score of 17 predicts a distant recurrence rate of 11% within 10 years if the patient's only systemic treatment is tamoxifen for 5 years.  (3)  status post radiation therapy, completed 03/11/2013  (4)  started on anastrozole 03/13/2013;   (a) bone density 05/25/2013 was normal  (5) intermittent hypercalcemia; PTH pending    PLAN:  Makynna is tolerating the anastrozole well, and obtaining it at a very good price. The plan will be to continue this for a total of 5 years, at which point she will "graduate".  I have urged her to start exercising on a  regular basis. The ride for days on end, but her husband and she do stop for a variety of reasons, and she could take 5 or 10 minute walks every time that happens. The idea would be to add up to 30-45 minutes of walking daily.   She has a yeast infection under her left breast. She understands this happens to people with diabetes. I have called in a miconazole ream scrirpt for her to use BID until clear.   I am not sure why her calcium continues to go up and down. I have suggested she hold off on calcium is limitation for now. I am setting a PTH today. She also requested a hemoglobin A1c. Finally she tells me her gynecologist, whom she really gets along with, is retiring. She would like to be referred to someone in our system.  She will have her next mammogram in May. She will see [place] again in July. She knows to call for any problems that may develop before that visit.     Esperansa Sarabia C    12/18/2014

## 2014-12-18 NOTE — Telephone Encounter (Signed)
PT SENT BACK TO LAB AND GIVEN AVS REPORT AND APPTS FOR JULY. PT GIVEN INFO FOR DR Stanton Kidney [facility] AND WILL CONTACT OFFICE RE APPT.

## 2014-12-19 LAB — PTH, INTACT AND CALCIUM
Calcium: 10.6 mg/dL — ABNORMAL HIGH (ref 8.4–10.5)
PTH: 22 pg/mL (ref 14–64)

## 2014-12-19 LAB — HEMOGLOBIN A1C
HEMOGLOBIN A1C: 8.2 % — AB (ref <5.7)
MEAN PLASMA GLUCOSE: 189 mg/dL — AB (ref <117)

## 2014-12-20 ENCOUNTER — Telehealth: Payer: Self-pay | Admitting: *Deleted

## 2014-12-20 NOTE — Telephone Encounter (Signed)
Patient called to get HgbA1C result. Result 8.2, patient verbalized understanding.

## 2015-01-11 ENCOUNTER — Telehealth: Payer: Self-pay | Admitting: Obstetrics & Gynecology

## 2015-01-11 NOTE — Telephone Encounter (Signed)
Called and left patient a message to call back to schedule her new patient doctor referral with Dr. Sabra Heck for an AEX.

## 2015-03-09 ENCOUNTER — Encounter (INDEPENDENT_AMBULATORY_CARE_PROVIDER_SITE_OTHER): Payer: Self-pay

## 2015-03-09 ENCOUNTER — Ambulatory Visit
Admission: RE | Admit: 2015-03-09 | Discharge: 2015-03-09 | Disposition: A | Discharge status: Home / Self Care | Payer: BLUE CROSS/BLUE SHIELD | Source: Ambulatory Visit | Attending: Oncology | Admitting: Oncology

## 2015-03-09 DIAGNOSIS — Z853 Personal history of malignant neoplasm of breast: Secondary | ICD-10-CM

## 2015-03-22 ENCOUNTER — Ambulatory Visit: Payer: BLUE CROSS/BLUE SHIELD | Admitting: Obstetrics & Gynecology

## 2015-04-23 ENCOUNTER — Other Ambulatory Visit: Payer: BLUE CROSS/BLUE SHIELD

## 2015-04-23 ENCOUNTER — Ambulatory Visit: Payer: BLUE CROSS/BLUE SHIELD | Admitting: Nurse Practitioner

## 2015-05-03 ENCOUNTER — Telehealth: Payer: Self-pay | Admitting: Nurse Practitioner

## 2015-05-03 ENCOUNTER — Other Ambulatory Visit: Payer: Self-pay | Admitting: *Deleted

## 2015-05-03 ENCOUNTER — Ambulatory Visit (HOSPITAL_BASED_OUTPATIENT_CLINIC_OR_DEPARTMENT_OTHER): Payer: BLUE CROSS/BLUE SHIELD | Admitting: Nurse Practitioner

## 2015-05-03 ENCOUNTER — Encounter: Payer: Self-pay | Admitting: Nurse Practitioner

## 2015-05-03 ENCOUNTER — Encounter: Payer: Self-pay | Admitting: Obstetrics and Gynecology

## 2015-05-03 ENCOUNTER — Other Ambulatory Visit (HOSPITAL_BASED_OUTPATIENT_CLINIC_OR_DEPARTMENT_OTHER): Payer: BLUE CROSS/BLUE SHIELD

## 2015-05-03 ENCOUNTER — Ambulatory Visit (INDEPENDENT_AMBULATORY_CARE_PROVIDER_SITE_OTHER): Payer: BLUE CROSS/BLUE SHIELD | Admitting: Obstetrics and Gynecology

## 2015-05-03 ENCOUNTER — Other Ambulatory Visit: Payer: Self-pay | Admitting: Nurse Practitioner

## 2015-05-03 ENCOUNTER — Ambulatory Visit: Payer: BLUE CROSS/BLUE SHIELD | Admitting: Obstetrics & Gynecology

## 2015-05-03 VITALS — BP 156/77 | HR 83 | Temp 97.1°F | Resp 17 | Ht 65.0 in | Wt 235.6 lb

## 2015-05-03 VITALS — BP 122/82 | HR 76 | Resp 16 | Ht 65.0 in | Wt 236.2 lb

## 2015-05-03 DIAGNOSIS — Z0000 Encounter for general adult medical examination without abnormal findings: Secondary | ICD-10-CM | POA: Diagnosis not present

## 2015-05-03 DIAGNOSIS — M25559 Pain in unspecified hip: Secondary | ICD-10-CM | POA: Diagnosis not present

## 2015-05-03 DIAGNOSIS — Z01419 Encounter for gynecological examination (general) (routine) without abnormal findings: Secondary | ICD-10-CM | POA: Diagnosis not present

## 2015-05-03 DIAGNOSIS — M8580 Other specified disorders of bone density and structure, unspecified site: Secondary | ICD-10-CM | POA: Diagnosis not present

## 2015-05-03 DIAGNOSIS — E119 Type 2 diabetes mellitus without complications: Secondary | ICD-10-CM

## 2015-05-03 DIAGNOSIS — N390 Urinary tract infection, site not specified: Secondary | ICD-10-CM | POA: Diagnosis not present

## 2015-05-03 DIAGNOSIS — R82998 Other abnormal findings in urine: Secondary | ICD-10-CM

## 2015-05-03 DIAGNOSIS — C50411 Malignant neoplasm of upper-outer quadrant of right female breast: Secondary | ICD-10-CM

## 2015-05-03 LAB — LIPID PANEL
CHOL/HDL RATIO: 4.6 ratio
CHOLESTEROL: 220 mg/dL — AB (ref 0–200)
Cholesterol: 228 mg/dL — ABNORMAL HIGH (ref 0–200)
HDL: 50 mg/dL (ref >=46)
HDL: 48 mg/dL (ref >=46)
LDL CALC: 136 mg/dL — AB (ref 0–99)
LDL Cholesterol: 135 mg/dL — ABNORMAL HIGH (ref 0–99)
Total CHOL/HDL Ratio: 4.6 Ratio
Triglycerides: 209 mg/dL — ABNORMAL HIGH (ref <150)
Triglycerides: 185 mg/dL — ABNORMAL HIGH (ref <150)
VLDL: 42 mg/dL — ABNORMAL HIGH (ref 0–40)
VLDL: 37 mg/dL (ref 0–40)

## 2015-05-03 LAB — CBC WITH DIFFERENTIAL/PLATELET
BASO%: 0.4 % (ref 0.0–2.0)
Basophils Absolute: 0 10*3/uL (ref 0.0–0.1)
EOS%: 1.1 % (ref 0.0–7.0)
Eosinophils Absolute: 0.1 10*3/uL (ref 0.0–0.5)
HEMATOCRIT: 39.1 % (ref 34.8–46.6)
HGB: 13.7 g/dL (ref 11.6–15.9)
LYMPH%: 33 % (ref 14.0–49.7)
MCH: 31.4 pg (ref 25.1–34.0)
MCHC: 35 g/dL (ref 31.5–36.0)
MCV: 89.7 fL (ref 79.5–101.0)
MONO#: 0.4 10*3/uL (ref 0.1–0.9)
MONO%: 7.1 % (ref 0.0–14.0)
NEUT#: 3.2 10*3/uL (ref 1.5–6.5)
NEUT%: 58.4 % (ref 38.4–76.8)
Platelets: 174 10*3/uL (ref 145–400)
RBC: 4.36 10*6/uL (ref 3.70–5.45)
RDW: 12.9 % (ref 11.2–14.5)
WBC: 5.5 10*3/uL (ref 3.9–10.3)
lymph#: 1.8 10*3/uL (ref 0.9–3.3)

## 2015-05-03 LAB — POCT URINALYSIS DIPSTICK
Bilirubin, UA: NEGATIVE
Ketones, UA: NEGATIVE
NITRITE UA: POSITIVE
PH UA: 5
Protein, UA: NEGATIVE
UROBILINOGEN UA: NEGATIVE

## 2015-05-03 LAB — COMPREHENSIVE METABOLIC PANEL (CC13)
ALK PHOS: 55 U/L (ref 40–150)
ALT: 28 U/L (ref 0–55)
AST: 27 U/L (ref 5–34)
Albumin: 3.7 g/dL (ref 3.5–5.0)
Anion Gap: 10 mEq/L (ref 3–11)
BUN: 9.3 mg/dL (ref 7.0–26.0)
CHLORIDE: 103 meq/L (ref 98–109)
CO2: 26 meq/L (ref 22–29)
Calcium: 10.6 mg/dL — ABNORMAL HIGH (ref 8.4–10.4)
Creatinine: 0.7 mg/dL (ref 0.6–1.1)
EGFR: 85 mL/min/{1.73_m2} — AB (ref >90)
Glucose: 131 mg/dl (ref 70–140)
Potassium: 3.9 mEq/L (ref 3.5–5.1)
Sodium: 139 mEq/L (ref 136–145)
Total Bilirubin: 0.73 mg/dL (ref 0.20–1.20)
Total Protein: 7.1 g/dL (ref 6.4–8.3)

## 2015-05-03 LAB — HEMOGLOBIN, FINGERSTICK: Hemoglobin, fingerstick: 13.4 g/dL (ref 12.0–16.0)

## 2015-05-03 LAB — HEMOGLOBIN A1C
HEMOGLOBIN A1C: 8.5 % — AB (ref <5.7)
MEAN PLASMA GLUCOSE: 197 mg/dL — AB (ref <117)

## 2015-05-03 MED ORDER — ANASTROZOLE 1 MG PO TABS: 1.0000 mg | ORAL_TABLET | Freq: Every day | ORAL | Status: DC

## 2015-05-03 NOTE — Addendum Note (Signed)
Addended by: Charmayne Sheer on: 05/03/2015 01:41 PM   Modules accepted: Orders

## 2015-05-03 NOTE — Progress Notes (Signed)
ID: Linda Boyer   DOB: 1951/02/27  MR#: [id number]  QIW#:[id number]  PCP: Verdis Prime GYN:  SU: Linda Boyer OTHER MD: Arloa Koh  CHIEF COMPLAINT: Early stage estrogen receptor positive breast cancer  CURRENT TREATMENT: Anastrozole   BREAST CANCERHISTORY: From the original intake note:  Linda Boyer originally presented with discomfort in the lateral aspect of the left breast. She was referred for mammography to the breast Center 10/30/2011, and this showed dense breast tissue bilaterally but no suspicious finding in the left breast. There was no palpable abnormality. Ultrasound of the left breast was negative   On repeat diagnostic mammography a year later however, 11/01/2012, a spiculated mass was found in the upper right breast. There were a few associated punctate calcifications. An area of focal thickening was palpable and ultrasound confirmed a 1.2 cm irregular hypoechoic mass at the 12:00 position of the right breast 3 cm from the nipple. There was no abnormal adenopathy. The rest of the right breast and the left breast were unremarkable.  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 The MIB-1-1 was 16%. There was no HER-2 amplification.  The patient's subsequent history is as detailed below  INTERVAL HISTORY: Linda Boyer returns today for follow up of her breast cancer. She has been on anastrozole since June 2014, and is tolerating it well. She denies arthralgias/myalgias, or vaginal changes. She had moderate hot flashes to begin with, but this symptom eventually backed off.She met with her new OBGYN this morning, and she plans to perform a pelvic ultrasound ultrasound to investigate lower abdominal pain nearly monthly.  The interval history is otherwise generally unremarkable.   REVIEW OF SYSTEMS: Linda Boyer denies fevers, chills, or changes in bowel or bladder  habits. She has some mild nausea some morning after taking her pills on an empty stomach. Otherwise she is eating and drinking well. She denies shortness of breath, chest pain, cough, or palpitations. She has some dizziness with her blood pressure pills, but denies headaches, vision changes, or weakness. She has some lower extremity edema while riding in the truck with her husband, but she does elevate them and walk at rest stops when she can. She endorses anxiety and "worrying" but denies depression. A detailed review of systems is otherwise stable.  PAST MEDICAL HISTORY: Past Medical History  Diagnosis Date  . Breast cancer     right side, er 86% +, PR 100%  . Diabetes mellitus without complication   . Hypertension     sees Dr. Verdis Prime [phone]  . Sleep apnea     "has not used cpap in a long time", sleep study over 5 years ago  . Anxiety    hypertension  PAST SURGICAL HISTORY: Past Surgical History  Procedure Laterality Date  . Colonoscopy    . Breast lumpectomy with needle localization and axillary sentinel lymph node bx Right 12/10/2012    Procedure: BREAST LUMPECTOMY WITH NEEDLE LOCALIZATION AND AXILLARY SENTINEL LYMPH NODE BX;  Surgeon: Harl Bowie, MD;  Location: [facility];  Service: General;  Laterality: Right;  . Breast surgery  11/2012    Rt. lumpectomy--    FAMILY HISTORY Family History  Problem Relation Age of Onset  . Heart attack Father 11    deceased  . Kidney failure Mother     dec 75  . Diabetes Mother   . Cancer Brother 27    Dec lung ca  .  Cancer Brother 55    Dec lung ca  . Thyroid disease Sister    The patient's father died from a myocardial infarction at age 34. The patient's mother died from renal failure at age 33. The patient had 2 half brothers. Both died from lung cancer in the setting of tobacco abuse. The patient has 4 sisters. There is no history of breast or ovarian cancer in the family.  GYNECOLOGIC HISTORY: Menarche age 32,  first live birth age 89, she is [place] P1. Menopause 2009. She never took hormone replacement.  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 Her husband Linda Boyer is a Administrator, and she makes calls to arrange for his loads. She also accompanies him on some of his long rides, which means she is away from home are quite a bit. Their son Linda Boyer lives in [place] where he works as a Dealer. The patient has 1 grandchild, currently 66 years old. She is not a Ambulance person.  ADVANCED DIRECTIVES: Not in place  HEALTH MAINTENANCE: History  Substance Use Topics  . Smoking status: Never Smoker   . Smokeless tobacco: Not on file  . Alcohol Use: No     Colonoscopy: 2013  PAP: 2012  Bone density: 208/13/2014 / normal ([facility])  Lipid panel:  No Known Allergies  Current Outpatient Prescriptions  Medication Sig Dispense Refill  . BD PEN NEEDLE NANO U/F 32G X 4 MM MISC   1  . fish oil-omega-3 fatty acids 1000 MG capsule Take 1 g by mouth 2 (two) times daily.     Marland Kitchen FREESTYLE LITE test strip 3 (three) times daily. for testing  1  . glipiZIDE (GLUCOTROL) 10 MG tablet Take 20 mg by mouth 2 (two) times daily before a meal.    . Lancets (FREESTYLE) lancets     . LANTUS SOLOSTAR 100 UNIT/ML Solostar Pen     . lisinopril (PRINIVIL,ZESTRIL) 20 MG tablet Take 20 mg by mouth daily.    . metFORMIN (GLUCOPHAGE) 500 MG tablet Take 1,000 mg by mouth 2 (two) times daily with a meal.    . Multiple Vitamin (MULTIVITAMIN WITH MINERALS) TABS Take 1 tablet by mouth daily.    . vitamin E 1000 UNIT capsule Take 1,000 Units by mouth daily.    Marland Kitchen anastrozole (ARIMIDEX) 1 MG tablet Take 1 tablet (1 mg total) by mouth daily. 30 tablet 12  . LORazepam (ATIVAN) 0.5 MG tablet 0.5 mg as needed.      No current facility-administered medications for this visit.      Objective: Middle-aged white woman who appears stated age  64 Vitals:   05/03/15 1358   BP: 156/77  Pulse: 83  Temp: 97.1 F (36.2 C)  Resp: 17     Body mass index is 39.21 kg/(m^2).    ECOG FS: 1 Filed Weights   05/03/15 1358  Weight: 235 lb 9.6 oz (106.867 kg)   Skin: warm, dry  HEENT: sclerae anicteric, conjunctivae pink, oropharynx clear. No thrush or mucositis.  Lymph Nodes: No cervical or supraclavicular lymphadenopathy  Lungs: clear to auscultation bilaterally, no rales, wheezes, or rhonci  Heart: regular rate and rhythm  Abdomen: round, soft, non tender, positive bowel sounds  Musculoskeletal: No focal spinal tenderness, no peripheral edema  Neuro: non focal, well oriented, positive affect  Breasts: right breast status post lumpectomy and radiation. No evidence of recurrent disease. Right axilla benign. Left breast unremarkable.  LAB RESULTS: Lab Results  Component Value Date   WBC 5.5 05/03/2015   NEUTROABS 3.2 05/03/2015   HGB 13.7 05/03/2015   HCT 39.1 05/03/2015   MCV 89.7 05/03/2015   PLT 174 05/03/2015      Chemistry      Component Value Date/Time   NA 139 05/03/2015 1330   NA 134* 12/03/2012 1404   K 3.9 05/03/2015 1330   K 4.1 12/03/2012 1404   CL 93* 12/03/2012 1404   CL 96* 11/17/2012 1223   CO2 26 05/03/2015 1330   CO2 29 12/03/2012 1404   BUN 9.3 05/03/2015 1330   BUN 12 12/03/2012 1404   CREATININE 0.7 05/03/2015 1330   CREATININE 0.65 12/03/2012 1404      Component Value Date/Time   CALCIUM 10.6* 05/03/2015 1330   CALCIUM 10.6* 12/18/2014 1456   ALKPHOS 55 05/03/2015 1330   AST 27 05/03/2015 1330   ALT 28 05/03/2015 1330   BILITOT 0.73 05/03/2015 1330       STUDIES:  No results found. EXAM: DIGITAL DIAGNOSTIC BILATERAL MAMMOGRAM WITH 3D TOMOSYNTHESIS AND CAD  COMPARISON: Previous examinations.  ACR Breast Density Category c: The breast tissue is heterogeneously dense, which may obscure small masses.  FINDINGS: Mildly improved post lumpectomy and postradiation changes on the right. No new findings  suspicious for malignancy in either breast.  Mammographic images were processed with CAD.  IMPRESSION: No evidence of malignancy.  RECOMMENDATION: Bilateral diagnostic mammogram in 1 year.  I have discussed the findings and recommendations with the patient. Results were also provided in writing at the conclusion of the visit. If applicable, a reminder letter will be sent to the patient regarding the next appointment.  BI-RADS CATEGORY 2: Benign.   Electronically Signed  By: Claudie Revering M.D.  On: 03/09/2015 12:14  ASSESSMENT: 64 y.o. [place], [place] woman   (1)  status post right breast lumpectomy and sentinel lymph node sampling 12/10/2012 for a pT1b pN0, stage IA invasive ductal carcinoma, grade 1, estrogen receptor 86% and progesterone receptor 100% positive, with an MIB-1 of 16% and no HER-2 amplification   (2) Oncotype DX score of 17 predicts a distant recurrence rate of 11% within 10 years if the patient's only systemic treatment is tamoxifen for 5 years.  (3)  status post radiation therapy, completed 03/11/2013  (4)  started on anastrozole 03/13/2013;   (a) bone density 05/25/2013 was normal  (5) intermittent hypercalcemia; PTH normal   PLAN:  Saul is doing well as far as her breast cancer is concerned. She is now 2 years out from her definitive surgery with no evidence of recurrent disease. We discussed her last mammogram and correspondence from [place], detailing the fact that she has dense breasts. I reassure her that she has already been "upgraded" to the 3D mammogram, and she will not require any further imaging at this time.   She is tolerating the anastrozole well and will continue the drug for 5 years of antiestrogen therapy. She is due for a repeat bone density scan this year, so I have placed orders for this to be performed next month. The labs were reviewed in detail and were stable. Her mild hypercalcemia continues, but she has already  discontinued all supplements, and her last PTH was normal. The A1c and lipid panel are pending, but the nurse will call her with the results tomorrow.  Anwar will return for labs and a follow up visit in 6 months. She understands and agrees with this  plan. She knows the goal of treatment in her case is cure. She has been encouraged to call with any issues that might arise before her next visit here.   Total time spent in appointment was 25 minutes, with greater than 50% of the time spent face to face with the patient.    Genelle Gather Alyjah Lovingood    05/03/2015

## 2015-05-03 NOTE — Telephone Encounter (Signed)
Gave avs & calendar for January °

## 2015-05-03 NOTE — Patient Instructions (Signed)

## 2015-05-03 NOTE — Progress Notes (Signed)
Patient ID: Linda Boyer, female   DOB: 09/02/1951, 64 y.o.   MRN: [id number] 64 y.o. G105P1001 Married Caucasian female here for annual exam.    Patient does complain of pelvic pain in left and right lower quadrants which comes at the end of each month and began about a year ago. Occurs at the end of each month.  Denies postmenopausal bleeding.  Hx of irregular menses during her life and had late menopause.  Remote history of ovarian cyst.   Urine dip positive today. Denies dysuria.  Hx of UTIs.  Worried about density of her breasts. Will see the PA at the [facility] today.  Will do routine labs with [facility].   Worried about reproductive cancers.  No family history of reproductive cancer.  Patient states, "I am a worrier."  Hypertension, DM, elevated calcium.   Luz Lex a lot for husbands work.   PCP:  Verdis Prime, MD (in [place], [place]) Oncologist:  Gus Magrinat, MD.  Patient's last menstrual period was 08/13/2008 (approximate).          Sexually active: Yes.  female  The current method of family planning is post menopausal status.    Exercising: No.  none. Smoker:  no  Health Maintenance: Pap:  2015 normal. History of abnormal Pap:  no MMG:  03-09-15 Hx.Rt.Lumpectomy for Br.Ca, Density Cat:C/post lumpectomy changes/neg/BiRads 2:[facility] Colonoscopy:  2013 normal at [facility].  Hx of polyps.  Next due 2018? BMD:   2011  Result  Normal with PCP TDaP:  PCP Screening Labs:  Hb today: 13.4, Urine today: 2+WBCs, Pos.Nitrities, Tr.RBCs   reports that she has never smoked. She does not have any smokeless tobacco history on file. She reports that she does not drink alcohol or use illicit drugs.  Past Medical History  Diagnosis Date  . Breast cancer     right side, er 86% +, PR 100%  . Diabetes mellitus without complication   . Hypertension     sees Dr. Verdis Prime [phone]  . Sleep apnea     "has not used cpap in a long time", sleep study  over 5 years ago  . Anxiety     Past Surgical History  Procedure Laterality Date  . Colonoscopy    . Breast lumpectomy with needle localization and axillary sentinel lymph node bx Right 12/10/2012    Procedure: BREAST LUMPECTOMY WITH NEEDLE LOCALIZATION AND AXILLARY SENTINEL LYMPH NODE BX;  Surgeon: Harl Bowie, MD;  Location: [facility];  Service: General;  Laterality: Right;  . Breast surgery  11/2012    Rt. lumpectomy--    Current Outpatient Prescriptions  Medication Sig Dispense Refill  . anastrozole (ARIMIDEX) 1 MG tablet Take 1 tablet (1 mg total) by mouth daily. 30 tablet 12  . BD PEN NEEDLE NANO U/F 32G X 4 MM MISC   1  . fish oil-omega-3 fatty acids 1000 MG capsule Take 1 g by mouth 2 (two) times daily.     Marland Kitchen FREESTYLE LITE test strip 3 (three) times daily. for testing  1  . glipiZIDE (GLUCOTROL) 10 MG tablet Take 20 mg by mouth 2 (two) times daily before a meal.    . Lancets (FREESTYLE) lancets     . LANTUS SOLOSTAR 100 UNIT/ML Solostar Pen     . lisinopril (PRINIVIL,ZESTRIL) 20 MG tablet Take 20 mg by mouth daily.    Marland Kitchen LORazepam (ATIVAN) 0.5 MG tablet 0.5 mg as needed.     . metFORMIN (GLUCOPHAGE) 500  MG tablet Take 1,000 mg by mouth 2 (two) times daily with a meal.    . Multiple Vitamin (MULTIVITAMIN WITH MINERALS) TABS Take 1 tablet by mouth daily.    . vitamin E 1000 UNIT capsule Take 1,000 Units by mouth daily.     No current facility-administered medications for this visit.    Family History  Problem Relation Age of Onset  . Heart attack Father 52    deceased  . Kidney failure Mother     dec 75  . Diabetes Mother   . Cancer Brother 39    Dec lung ca  . Cancer Brother 55    Dec lung ca  . Thyroid disease Sister     ROS:  Pertinent items are noted in HPI.  Otherwise, a comprehensive ROS was negative.  Exam:   BP 122/82 mmHg  Pulse 76  Resp 16  Ht 5\' 5"  (1.651 m)  Wt 236 lb 3.2 oz (107.14 kg)  BMI 39.31 kg/m2  LMP 08/13/2008 (Approximate)    General  appearance: alert, cooperative and appears stated age Head: Normocephalic, without obvious abnormality, atraumatic Neck: no adenopathy, supple, symmetrical, trachea midline and thyroid normal to inspection and palpation Lungs: clear to auscultation bilaterally Breasts: normal appearance, no masses or tenderness, Inspection negative, No nipple retraction or dimpling, No nipple discharge or bleeding, No axillary or supraclavicular adenopathy.  Right breast with axillary and right breast radial scar.  Radiations changes noted.  Heart: regular rate and rhythm Abdomen: Obese, 2 1.5 cm nodules of subcutaneous tissue in upper midlines abdomen (previously told these are lipomas) soft, non-tender; bowel sounds normal; no masses,  no organomegaly.  No guarding.  No rebound. Extremities: extremities normal, atraumatic, no cyanosis or edema Skin: Skin color, texture, turgor normal. No rashes or lesions Lymph nodes: Cervical, supraclavicular, and axillary nodes normal. No abnormal inguinal nodes palpated Neurologic: Grossly normal  Pelvic: External genitalia:  no lesions              Urethra:  normal appearing urethra with no masses, tenderness or lesions              Bartholins and Skenes: normal                 Vagina: normal appearing vagina with normal color and discharge, no lesions.              Cervix: no lesions              Pap taken: Yes.   Bimanual Exam:  Uterus:  normal size, contour, position, consistency, mobility, non-tender  Exam limited by body habitus and involuntary guarding.               Adnexa: normal adnexa and no mass, fullness, tenderness              Rectovaginal: Yes.  .  Confirms.              Anus:  normal sphincter tone, no lesions  Chaperone was present for exam.  Assessment:   Well woman visit with normal exam. Pelvic pain - chronic. Limited pelvic exam due to body habitus.  Positive urine dip.  Hx of UTIs.   Right breast cancer.   Status post lumpectomy and XRT. On  Arimidex. DM.  Hypercalcemia.   Plan: Yearly mammogram recommended after age 46.  I encouraged patient to discuss her imaging concerns with her oncology team.  Recommended self breast exam.  Follow up for routine visits  with Dr. Jana Hakim and his team.  Pap and HR HPV as above. Discussed Calcium, Vitamin D, regular exercise program including cardiovascular and weight bearing exercise. Labs performed.  Yes.  .   See orders.  Future order for cholesterol check today at [facility] when she does her blood work today.  UC also sent. Refills given on medications.  No..     Return for pelvic ultrasound. Patient will see PCP in August for routine examination.  Follow up annually and prn.     After visit summary provided.

## 2015-05-04 ENCOUNTER — Telehealth: Payer: Self-pay | Admitting: *Deleted

## 2015-05-04 ENCOUNTER — Other Ambulatory Visit: Payer: Self-pay | Admitting: *Deleted

## 2015-05-04 ENCOUNTER — Other Ambulatory Visit: Payer: BLUE CROSS/BLUE SHIELD

## 2015-05-04 ENCOUNTER — Ambulatory Visit: Payer: BLUE CROSS/BLUE SHIELD | Admitting: Nurse Practitioner

## 2015-05-04 NOTE — Telephone Encounter (Signed)
Called to review results with pt concerning Hgb A1c and Lipid panel. I went over the results with pt explaining each and will mail a copy of these results to her so she can take them with her on Aug. 10th to her appt with PCP. I also informed her of the bone density that was ordered for her on yesterday and told [facility] to [facility] and to get this done before the end of the year. Pt verbalized understanding and thanked [place] for the callback. Message to be forwarded to [facility].

## 2015-05-05 LAB — URINE CULTURE

## 2015-05-07 ENCOUNTER — Telehealth: Payer: Self-pay

## 2015-05-07 LAB — IPS PAP TEST WITH HPV

## 2015-05-07 MED ORDER — CIPROFLOXACIN HCL 500 MG PO TABS: 500.0000 mg | ORAL_TABLET | Freq: Two times a day (BID) | ORAL | Status: DC

## 2015-05-07 NOTE — Telephone Encounter (Signed)
-----   Message from Nunzio Cobbs, MD sent at 05/06/2015  8:55 PM EDT ----- Please contact patient with UC and lipid profile.  Needs treatment for Klebsiella UTI.  Please send in Rx for Ciprofloxacin 500 mg po bid for 7 days to her pharmacy of choice.  She will need a test of cure in 10 days.   She will need to see her PCP for follow up of her elevated cholesterol and triglycerides. It looks like she needs to increase her exercise and lower the saturated fat and cholesterol in her diet.

## 2015-05-07 NOTE — Telephone Encounter (Signed)
Thank you for the update.  As long as the patient uses a chain pharmacy, the Cipro can be transferred to another pharmacy out of state.

## 2015-05-07 NOTE — Telephone Encounter (Signed)
Pt. Called back wanting to know if we received her pap results yet?  Advised pt.we have just received today and pap and HR HPV negative.  Patient asked if I could mail Cipro RX to her along with copy of labs?  Advised pt. I had already mailed copy of labs to her this morning and have Escribed Cipro Rx.

## 2015-05-07 NOTE — Telephone Encounter (Signed)
Spoke with pt.and Escribed Cipro--see Result Note dated 05-07-15.

## 2015-05-11 ENCOUNTER — Telehealth: Payer: Self-pay | Admitting: Oncology

## 2015-05-11 ENCOUNTER — Telehealth: Payer: Self-pay | Admitting: Obstetrics and Gynecology

## 2015-05-11 NOTE — Telephone Encounter (Signed)
Confirmed appointment for 08/10 Bone density

## 2015-05-11 NOTE — Telephone Encounter (Signed)
Spoke with patient. Scheduled for PUS on 06/14/2015 at 10:30am with 11am consult with Dr.Silva. Patient is agreeable to date and time. Order has been placed. Will need precert.  Cc: Theresia Lo  Routing to provider for final review. Patient agreeable to disposition. Will close encounter.   Patient aware provider will review message and nurse will return call if any additional advice or change of disposition.

## 2015-05-11 NOTE — Telephone Encounter (Signed)
Spoke with patient. Patient is requesting PUS appointment to be scheduled for 06/14/2015. Patient would like to have BMD performed on the same day. Is going to [facility] with [facility] to set up that appointment and then will return call to schedule PUS.

## 2015-05-11 NOTE — Telephone Encounter (Signed)
Patient calling to schedule an ultrasound appointment. °

## 2015-05-23 ENCOUNTER — Other Ambulatory Visit: Payer: BLUE CROSS/BLUE SHIELD

## 2015-06-08 ENCOUNTER — Telehealth: Payer: Self-pay | Admitting: Obstetrics and Gynecology

## 2015-06-08 NOTE — Telephone Encounter (Signed)
Mail going out on Monday will not reach the patient in time to remind her of appointment on Thursday 06-14-15.  Please contact patient on Monday and clarify her intentions for appointment, may be better to delay by a week.  Routing to provider for final review.  Will close encounter.

## 2015-06-08 NOTE — Telephone Encounter (Signed)
Spoke with patient and reviewed benefit for PUS scheduled 06/14/15. Patient understood and agreeable to benefit. 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 I reviewed 72 hour cancellation policy and patient requested to reschedule while on the phone. Patient rescheduled for 06/21/15 and agreeable to cancellation policy. Patient requested appointment reminder mailed to her [address] in [place]. Verified patients mailing address while on the phone. Printed appointment information and mailed to patient to be sent with mail on Monday 06/12/15. Routing to [facility] for review prior to closing.

## 2015-06-14 ENCOUNTER — Other Ambulatory Visit: Payer: BLUE CROSS/BLUE SHIELD | Admitting: Obstetrics and Gynecology

## 2015-06-14 ENCOUNTER — Other Ambulatory Visit: Payer: BLUE CROSS/BLUE SHIELD

## 2015-06-21 ENCOUNTER — Other Ambulatory Visit: Payer: BLUE CROSS/BLUE SHIELD

## 2015-06-21 ENCOUNTER — Telehealth: Payer: Self-pay | Admitting: Obstetrics and Gynecology

## 2015-06-21 ENCOUNTER — Other Ambulatory Visit: Payer: BLUE CROSS/BLUE SHIELD | Admitting: Obstetrics and Gynecology

## 2015-06-21 NOTE — Telephone Encounter (Signed)
Thank you for the update!

## 2015-06-21 NOTE — Telephone Encounter (Signed)
Patient canceled her PUS appointment today. Patient's mother-in-law is still in the hospital and not doing well. Patient is very sorry that she had to cancel and will call later to reschedule.

## 2015-06-27 ENCOUNTER — Telehealth: Payer: Self-pay | Admitting: *Deleted

## 2015-06-27 NOTE — Telephone Encounter (Signed)
Patient calling to reschedule ultrasound appointment. Best contact #: [phone]

## 2015-06-27 NOTE — Telephone Encounter (Signed)
Attempted to reach patient at number provided [phone]. There was no answer and recording states the voicemail box has not been set up yet.

## 2015-06-27 NOTE — Telephone Encounter (Signed)
Spoke with patient. Patient is ready to schedule PUS appointment at this time. Appointment scheduled for 08/09/2015 at 11 am with 11:30 am consult with Dr.Silva. Patient is agreeable to date and time. Patient verbalized understanding of the [place] appointment cancellation policy. Advised will need to cancel or reschedule within 72 business hours of appointment (3 business days) or will have $100.00 late cancellation fee placed to account. Order will need precert.  Cc: Theresia Lo  Routing to provider for final review. Patient agreeable to disposition. Will close encounter.

## 2015-07-27 ENCOUNTER — Other Ambulatory Visit: Payer: BLUE CROSS/BLUE SHIELD

## 2015-07-30 ENCOUNTER — Telehealth: Payer: Self-pay | Admitting: Obstetrics and Gynecology

## 2015-07-30 NOTE — Telephone Encounter (Signed)
Called patient to review benefit. Voicemail not set up yet. Unable to leave message. Benefit documented in appointment.

## 2015-08-09 ENCOUNTER — Ambulatory Visit
Admission: RE | Admit: 2015-08-09 | Discharge: 2015-08-09 | Disposition: A | Discharge status: Home / Self Care | Payer: BLUE CROSS/BLUE SHIELD | Source: Ambulatory Visit | Attending: Nurse Practitioner | Admitting: Nurse Practitioner

## 2015-08-09 ENCOUNTER — Ambulatory Visit (INDEPENDENT_AMBULATORY_CARE_PROVIDER_SITE_OTHER): Payer: BLUE CROSS/BLUE SHIELD | Admitting: Obstetrics and Gynecology

## 2015-08-09 ENCOUNTER — Encounter: Payer: Self-pay | Admitting: Obstetrics and Gynecology

## 2015-08-09 ENCOUNTER — Ambulatory Visit (INDEPENDENT_AMBULATORY_CARE_PROVIDER_SITE_OTHER): Payer: BLUE CROSS/BLUE SHIELD

## 2015-08-09 ENCOUNTER — Encounter (INDEPENDENT_AMBULATORY_CARE_PROVIDER_SITE_OTHER): Payer: BLUE CROSS/BLUE SHIELD | Admitting: Obstetrics and Gynecology

## 2015-08-09 VITALS — BP 138/80 | HR 62 | Resp 16

## 2015-08-09 DIAGNOSIS — R82998 Other abnormal findings in urine: Secondary | ICD-10-CM

## 2015-08-09 DIAGNOSIS — R938 Abnormal findings on diagnostic imaging of other specified body structures: Secondary | ICD-10-CM | POA: Diagnosis not present

## 2015-08-09 DIAGNOSIS — M25559 Pain in unspecified hip: Secondary | ICD-10-CM

## 2015-08-09 DIAGNOSIS — R9389 Abnormal findings on diagnostic imaging of other specified body structures: Secondary | ICD-10-CM

## 2015-08-09 DIAGNOSIS — R300 Dysuria: Secondary | ICD-10-CM

## 2015-08-09 DIAGNOSIS — M8580 Other specified disorders of bone density and structure, unspecified site: Secondary | ICD-10-CM

## 2015-08-09 DIAGNOSIS — N390 Urinary tract infection, site not specified: Secondary | ICD-10-CM | POA: Diagnosis not present

## 2015-08-09 LAB — POCT URINALYSIS DIPSTICK
Bilirubin, UA: NEGATIVE
Ketones, UA: NEGATIVE
Nitrite, UA: POSITIVE
PROTEIN UA: NEGATIVE
RBC UA: NEGATIVE
UROBILINOGEN UA: NEGATIVE
pH, UA: 5

## 2015-08-09 MED ORDER — SULFAMETHOXAZOLE-TRIMETHOPRIM 800-160 MG PO TABS: 1.0000 | ORAL_TABLET | Freq: Two times a day (BID) | ORAL | Status: DC

## 2015-08-09 NOTE — Patient Instructions (Signed)
Call for fever, heavy vaginal bleeding or worsening pain.

## 2015-08-09 NOTE — Progress Notes (Signed)
Subjective  64 y.o. G93P1001 Caucasian female here for pelvic ultrasound for pelvic pain.   Patient does complain of pelvic pain in left and right lower quadrants which comes at the end of each month and began about a year ago. Occurs at the end of each month.  Denies postmenopausal bleeding.  Hx of irregular menses during her life and had late menopause.  Remote history of ovarian cyst.   History of breast cancer and current Arimidex use.   Having urinary symptoms "always." Hx of UTIs.   Luz Lex a lot and has difficulty to come for appointments.  UA - WBCs - moderate, positive nitrites, moderate glucose. (has diabetes).   Objective  Pelvic ultrasound images and report reviewed with patient.  Uterus - no masses.   EMS - 7.25 mm with cystic spaces. Ovaries - right ovary larger than left ovary.  No masses or cysts.  Free fluid -  no      Assessment   Cystic endometrium.  On Arimidex.  Right ovary larger than left but no masses.  Positive urine dip.   Plan  Discussion of thickened cystic endometrium and need for further evaluation with endometrial biopsy.  Patient will return to do this at 3:00 today.  Will receive ibuprofen 800 mg and plan for a paracervical block.  Will send for urine micro and culture.  Will discuss treatment of potential UTI when patient returns this afternoon.   ___15____ minutes face to face time of which over 50% was spent in counseling.   Addendum  Patient returns for endometrial biopsy and results of the urine dip which is suggestive of UTI.   Procedure - endometrial biopsy Consent performed. Speculum place in vagina.  Sterile prep of cervix with Hibiclens. Tenaculum to anterior cervical lip. Paracervical block with 10 cc 1% lidocaine yes - lot [id number], expiration 10/14/15. Pipelle placed to   8       cm without difficulty twice. Tissue obtained and sent to pathology. Speculum removed.  No complications. Minimal EBL.  Will  follow up EMB results.  Instructions and precautions given.  Bactrim DS po bid for 5 days.  Urine micro and culture sent.   After visit summary to patient.

## 2015-08-09 NOTE — Patient Instructions (Signed)

## 2015-08-10 LAB — URINALYSIS, MICROSCOPIC ONLY
Casts: NONE SEEN [LPF]
Crystals: NONE SEEN [HPF]
RBC / HPF: NONE SEEN RBC/HPF (ref <=2)
YEAST: NONE SEEN [HPF]

## 2015-08-10 NOTE — Progress Notes (Signed)
This encounter was opened in error. See separate note and addendum for this day.

## 2015-08-11 LAB — URINE CULTURE

## 2015-08-13 LAB — IPS OTHER TISSUE BIOPSY

## 2015-08-15 ENCOUNTER — Telehealth: Payer: Self-pay

## 2015-08-15 NOTE — Telephone Encounter (Signed)
Called patient at [phone] to discuss results of urine culture, but mailbox not set up--unable to leave a message.

## 2015-08-15 NOTE — Telephone Encounter (Signed)
-----   Message from Nunzio Cobbs, MD sent at 08/12/2015  7:14 PM EDT ----- Please contact patient with UC results.  I did also release them through My Chart. The Bactrim I prescribed will treat the Klebsiella UTI.  Endometrial biopsy is pending.

## 2015-08-16 ENCOUNTER — Telehealth: Payer: Self-pay | Admitting: Obstetrics and Gynecology

## 2015-08-16 ENCOUNTER — Other Ambulatory Visit: Payer: Self-pay | Admitting: Oncology

## 2015-08-16 ENCOUNTER — Telehealth: Payer: Self-pay

## 2015-08-16 DIAGNOSIS — N840 Polyp of corpus uteri: Secondary | ICD-10-CM

## 2015-08-16 NOTE — Telephone Encounter (Signed)
Patient left message during lunch she said she is returning Amanda's call please see last phone note. Best # to reach [phone]

## 2015-08-16 NOTE — Telephone Encounter (Signed)
This RN returned call to pt post MD review of Dr Elza Rafter note including the transvaginal U/S and pathology report .  Informed pt per MD review of Dr Elza Rafter workup pt should proceed with sonohysterography as recommended. Informed pt per Dr Jannifer Rodney Arimidex has not been shown to  cause endometrial thickening.  Pt stated concern " because on my report it has on Arimidex "   This RN informed her that is just medical data and explained to pt medication tamoxifen can cause uterine thickening but she is not on this medication.  Post phone discussion including verifying with pt md review of records ( gave pt names of test - dates etc ) that Dr Jana Hakim recommends she continue on the arimidex and proceed with testing per Dr Quincy Simmonds.  This RN requested pt to call if she has further questions or concerns.

## 2015-08-16 NOTE — Telephone Encounter (Signed)
Pt called requesting we look at Dr Elza Rafter report in epic. Pt stated Dr Quincy Simmonds thought it may be from her being on Arimadex. She is requesting Dr Virgie Dad opinion on the further testing Dr Quincy Simmonds is considering.

## 2015-08-16 NOTE — Telephone Encounter (Signed)
Patient is returning a call to Amanda.

## 2015-08-16 NOTE — Telephone Encounter (Signed)
Results given--see results note of 08-15-15.

## 2015-08-16 NOTE — Telephone Encounter (Signed)
Phone call to patient per DPI.  Voice mailbox not set up, so unable to leave a message .  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   I would like to offer to the patient to do a sonohysterogram in the office to define the polyp further and understand if a dilation and curettage is needed.  I will send a message through on My Chart, as the patient is registered with My Chart.

## 2015-08-22 ENCOUNTER — Telehealth: Payer: Self-pay

## 2015-08-22 NOTE — Telephone Encounter (Signed)
-----   Message from Nunzio Cobbs, MD sent at 08/16/2015  1:20 PM EDT ----- Results to patient through My Chart. I have asked patient to call the office about a potential sonohysterogram appointment.

## 2015-08-22 NOTE — Telephone Encounter (Signed)
Nunzio Cobbs, MD at 08/16/2015 1:12 PM     Status: Signed       Expand All Collapse All   Phone call to patient per DPI.  Voice mailbox not set up, so unable to leave a message .  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   I would like to offer to the patient to do a sonohysterogram in the office to define the polyp further and understand if a dilation and curettage is needed.  I will send a message through on My Chart, as the patient is registered with My Chart.       Attempted to reach patient at number provided [phone]. There was no answer and the recording states that the voicemail box has not been set up yet. Will try again later.

## 2015-08-28 NOTE — Telephone Encounter (Signed)
Notes Recorded by Nunzio Cobbs, MD on 08/27/2015 at 6:39 AM Please send letter to patient asking for her to follow up in the office. Her endometrial biopsy showed an endocervical versus endometrial polyp.  She and I need to discuss sonohysterogram versus hysteroscopy and polyp removal and dilation and curettage.  Letter sent to patient's home address on file requesting return call to office to discuss her recent results and to set up a follow up appointment.  Routing to provider for final review. Patient agreeable to disposition. Will close encounter.

## 2015-08-31 NOTE — Telephone Encounter (Signed)
Encounter closed in error.  Advised of message as seen below form Dr.Silva. Patient is agreeable and verbalizes understanding. Patient states that she recently was placed on Medicare and her coverage will start December 1st. She says she has a temporary card with information she can provide. Advised will have Jacqlyn Larsen return call the first part of next week to get this information and check coverage for [facility]. Patient is agreeable. Patient is asking if her PUS on 08/09/2015 was covered. Advised she will need to speak with the billing department in our office regarding this. Patient is agreeable. Requests phone calls after 1 pm.  Cc: Lavell Luster

## 2015-08-31 NOTE — Addendum Note (Signed)
Addended by: Jasmine Awe on: 08/31/2015 05:14 PM   Modules accepted: Orders

## 2015-08-31 NOTE — Telephone Encounter (Signed)
Patient is returning Kaitlyn's call and MyChart message.

## 2015-08-31 NOTE — Telephone Encounter (Signed)
Nunzio Cobbs, MD at 08/16/2015 1:12 PM     Status: Signed       Expand All Collapse All   Phone call to patient per DPI.  Voice mailbox not set up, so unable to leave a message .  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   I would like to offer to the patient to do a sonohysterogram in the office to define the polyp further and understand if a dilation and curettage is needed.  I will send a message through on My Chart, as the patient is registered with My Chart.

## 2015-09-03 ENCOUNTER — Telehealth: Payer: Self-pay | Admitting: Obstetrics and Gynecology

## 2015-09-03 NOTE — Telephone Encounter (Signed)
Routing to Dr.Silva as FYI. Okay to close encounter? Patient to remain in workque.

## 2015-09-03 NOTE — Telephone Encounter (Signed)
Ok to close encounter and keep in work queue.

## 2015-09-03 NOTE — Telephone Encounter (Signed)
Spoke with patient regarding previous message about shgm and insurance. Answered all questions regarding benefits. Patient looking at different types of plans and aware this will affect the outcome of benefit varification. Patient wishes to call back in a couple of weeks to update insurance and schedule. At that time patient would also like to discuss specifics of procedure.

## 2015-09-04 NOTE — Telephone Encounter (Signed)
Routing to Theresia Lo to ensure patient remains in workque. Will close encounter.

## 2015-10-10 ENCOUNTER — Telehealth: Payer: Self-pay

## 2015-10-10 ENCOUNTER — Telehealth: Payer: Self-pay | Admitting: Obstetrics and Gynecology

## 2015-10-10 NOTE — Telephone Encounter (Signed)
Spoke with pt regarding benefit for sonohysterogram. Patient understood and agreeable. Patient ready to schedule. Patient scheduled 10/25/15 with Dr Quincy Simmonds. Pt aware of arrival date and time. Pt aware of 72 hours cancellation policy with [id number] fee. No further questions. Ok to close

## 2015-10-10 NOTE — Telephone Encounter (Signed)
Phone call passed from billing as patient has clinical questions about her appointment. Advised she will be having a SHGM here in the office with Dr.Silva. SHGM procedure explained. Patient is agreeable. All questions answered. Patient verbalizes understanding. Patient is scheduled for [facility] [place] on 10/25/2015 at 11 am with 11:30 am consult with Dr.Silva.  Routing to provider for final review. Patient agreeable to disposition. Will close encounter.

## 2015-10-14 DIAGNOSIS — N8501 Benign endometrial hyperplasia: Secondary | ICD-10-CM

## 2015-10-14 HISTORY — DX: Benign endometrial hyperplasia: N85.01

## 2015-10-18 ENCOUNTER — Other Ambulatory Visit: Payer: Self-pay | Admitting: *Deleted

## 2015-10-18 DIAGNOSIS — C50411 Malignant neoplasm of upper-outer quadrant of right female breast: Secondary | ICD-10-CM

## 2015-10-22 ENCOUNTER — Telehealth: Payer: Self-pay | Admitting: Oncology

## 2015-10-22 ENCOUNTER — Other Ambulatory Visit (HOSPITAL_BASED_OUTPATIENT_CLINIC_OR_DEPARTMENT_OTHER): Payer: Medicare Other

## 2015-10-22 ENCOUNTER — Other Ambulatory Visit: Payer: Self-pay | Admitting: *Deleted

## 2015-10-22 ENCOUNTER — Ambulatory Visit (HOSPITAL_BASED_OUTPATIENT_CLINIC_OR_DEPARTMENT_OTHER): Payer: Medicare Other | Admitting: Oncology

## 2015-10-22 VITALS — BP 140/71 | HR 101 | Temp 98.5°F | Resp 19 | Ht 65.0 in | Wt 240.6 lb

## 2015-10-22 DIAGNOSIS — C50411 Malignant neoplasm of upper-outer quadrant of right female breast: Secondary | ICD-10-CM | POA: Diagnosis present

## 2015-10-22 LAB — COMPREHENSIVE METABOLIC PANEL
ALBUMIN: 3.9 g/dL (ref 3.5–5.0)
ALK PHOS: 59 U/L (ref 40–150)
ALT: 46 U/L (ref 0–55)
AST: 45 U/L — AB (ref 5–34)
Anion Gap: 12 mEq/L — ABNORMAL HIGH (ref 3–11)
BUN: 15.9 mg/dL (ref 7.0–26.0)
CO2: 25 meq/L (ref 22–29)
Calcium: 11.1 mg/dL — ABNORMAL HIGH (ref 8.4–10.4)
Chloride: 97 mEq/L — ABNORMAL LOW (ref 98–109)
Creatinine: 1 mg/dL (ref 0.6–1.1)
EGFR: 58 mL/min/{1.73_m2} — ABNORMAL LOW (ref >90)
GLUCOSE: 204 mg/dL — AB (ref 70–140)
POTASSIUM: 4.6 meq/L (ref 3.5–5.1)
SODIUM: 134 meq/L — AB (ref 136–145)
Total Bilirubin: 0.82 mg/dL (ref 0.20–1.20)
Total Protein: 7.2 g/dL (ref 6.4–8.3)

## 2015-10-22 LAB — CBC WITH DIFFERENTIAL/PLATELET
BASO%: 0.6 % (ref 0.0–2.0)
BASOS ABS: 0 10*3/uL (ref 0.0–0.1)
EOS%: 1.2 % (ref 0.0–7.0)
Eosinophils Absolute: 0.1 10*3/uL (ref 0.0–0.5)
HCT: 46.4 % (ref 34.8–46.6)
HEMOGLOBIN: 15.6 g/dL (ref 11.6–15.9)
LYMPH%: 24.6 % (ref 14.0–49.7)
MCH: 32.2 pg (ref 25.1–34.0)
MCHC: 33.6 g/dL (ref 31.5–36.0)
MCV: 95.9 fL (ref 79.5–101.0)
MONO#: 0.5 10*3/uL (ref 0.1–0.9)
MONO%: 7.3 % (ref 0.0–14.0)
NEUT#: 4.5 10*3/uL (ref 1.5–6.5)
NEUT%: 66.3 % (ref 38.4–76.8)
Platelets: 190 10*3/uL (ref 145–400)
RBC: 4.84 10*6/uL (ref 3.70–5.45)
RDW: 13.1 % (ref 11.2–14.5)
WBC: 6.8 10*3/uL (ref 3.9–10.3)
lymph#: 1.7 10*3/uL (ref 0.9–3.3)

## 2015-10-22 MED ORDER — ANASTROZOLE 1 MG PO TABS: 1.0000 mg | ORAL_TABLET | Freq: Every day | ORAL | Status: DC
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
## 2015-10-22 NOTE — Telephone Encounter (Signed)
Appointments made and avs pritned for patient °

## 2015-10-23 ENCOUNTER — Other Ambulatory Visit: Payer: Self-pay | Admitting: *Deleted

## 2015-10-23 ENCOUNTER — Telehealth: Payer: Self-pay | Admitting: *Deleted

## 2015-10-23 DIAGNOSIS — E119 Type 2 diabetes mellitus without complications: Secondary | ICD-10-CM

## 2015-10-23 NOTE — Telephone Encounter (Signed)
"  I'd like results of my HGB A1C."  Called lab who has extra tube.  Obtained order will run test.  This nurse called patient notifying h er we'll call results within the next 24 to 48 hours as test will be run today.

## 2015-10-24 LAB — HEMOGLOBIN A1C
ESTIMATED AVERAGE GLUCOSE: 192 mg/dL
Hemoglobin A1c: 8.3 % — ABNORMAL HIGH (ref 4.8–5.6)

## 2015-10-25 ENCOUNTER — Other Ambulatory Visit: Payer: Self-pay | Admitting: Obstetrics and Gynecology

## 2015-10-25 ENCOUNTER — Encounter: Payer: Self-pay | Admitting: Obstetrics and Gynecology

## 2015-10-25 ENCOUNTER — Ambulatory Visit (INDEPENDENT_AMBULATORY_CARE_PROVIDER_SITE_OTHER): Payer: Medicare Other

## 2015-10-25 ENCOUNTER — Ambulatory Visit (INDEPENDENT_AMBULATORY_CARE_PROVIDER_SITE_OTHER): Payer: Medicare Other | Admitting: Obstetrics and Gynecology

## 2015-10-25 VITALS — BP 136/74 | HR 90 | Ht 65.0 in | Wt 241.0 lb

## 2015-10-25 DIAGNOSIS — N840 Polyp of corpus uteri: Secondary | ICD-10-CM

## 2015-10-25 NOTE — Progress Notes (Signed)
GYNECOLOGY  VISIT   HPI:  65 y.o. G42P1001 Married Caucasian female with LMP 08/13/2008 here for sonohysterogram evaluation of thickened cystic endometrium and EMB showing benign endocervical versus endometrial polyp. Ultrasound originally ordered for bilateral pelvic pain and personal concern for cancer. No postmenopausal bleeding.  No hormonal therapy.  On Arimidex for breast cancer.  GYNECOLOGIC HISTORY: Patient's last menstrual period was 08/13/2008 (approximate). Contraception:  Postmenopausal. Menopausal hormone therapy: none.  On Arimidex. Last mammogram: 03/09/15 - BI-RADS2. Last pap smear: 05/03/15 - WNL, negative HR HPV.         OB History    Gravida Para Term Preterm AB TAB SAB Ectopic Multiple Living   1 1 1  0 0 0 0 0 0 1      Obstetric Comments   81 when had her son 89 age of first period 35 bcp for a short time 79 age of last period in 2009         Patient Active Problem List   Diagnosis Date Noted  . Breast cancer of upper-outer quadrant of right female breast ([place]) 08/03/2013  . Controlled diabetes mellitus type II without complication ([place]) [id number]  . Postmenopausal estrogen deficiency 05/02/2013    Past Medical History  Diagnosis Date  . Breast cancer ([place])     right side, er 86% +, PR 100%  . Diabetes mellitus without complication ([place])   . Hypertension     sees Dr. Verdis Prime [phone]  . Sleep apnea     "has not used cpap in a long time", sleep study over 5 years ago  . Anxiety     Past Surgical History  Procedure Laterality Date  . Colonoscopy    . Breast lumpectomy with needle localization and axillary sentinel lymph node bx Right 12/10/2012    Procedure: BREAST LUMPECTOMY WITH NEEDLE LOCALIZATION AND AXILLARY SENTINEL LYMPH NODE BX;  Surgeon: Harl Bowie, MD;  Location: [facility];  Service: General;  Laterality: Right;  . Breast surgery  11/2012    Rt. lumpectomy--    Current Outpatient Prescriptions  Medication Sig Dispense  Refill  . anastrozole (ARIMIDEX) 1 MG tablet Take 1 tablet (1 mg total) by mouth daily. 90 tablet 12  . BD PEN NEEDLE NANO U/F 32G X 4 MM MISC   1  . fish oil-omega-3 fatty acids 1000 MG capsule Take 1 g by mouth 2 (two) times daily.     Marland Kitchen FREESTYLE LITE test strip 3 (three) times daily. for testing  1  . glipiZIDE (GLUCOTROL) 10 MG tablet Take 20 mg by mouth 2 (two) times daily before a meal.    . Lancets (FREESTYLE) lancets     . LANTUS SOLOSTAR 100 UNIT/ML Solostar Pen     . lisinopril (PRINIVIL,ZESTRIL) 20 MG tablet Take 20 mg by mouth daily.    Marland Kitchen LORazepam (ATIVAN) 0.5 MG tablet 0.5 mg as needed.     . metFORMIN (GLUCOPHAGE) 500 MG tablet Take 1,000 mg by mouth 2 (two) times daily with a meal.    . Multiple Vitamin (MULTIVITAMIN WITH MINERALS) TABS Take 1 tablet by mouth daily.    . vitamin E 1000 UNIT capsule Take 1,000 Units by mouth daily.     No current facility-administered medications for this visit.     ALLERGIES: Review of patient's allergies indicates no known allergies.  Family History  Problem Relation Age of Onset  . Heart attack Father 26    deceased  . Kidney failure Mother  dec 75  . Diabetes Mother   . Cancer Brother 66    Dec lung ca  . Cancer Brother 5    Dec lung ca  . Thyroid disease Sister     Social History   Social History  . Marital Status: Married    Spouse Name: N/A  . Number of Children: 1  . Years of Education: N/A   Occupational History  . Not on file.   Social History Main Topics  . Smoking status: Never Smoker   . Smokeless tobacco: Not on file  . Alcohol Use: No  . Drug Use: No  . Sexual Activity:    Partners: Male    Birth Control/ Protection: Post-menopausal   Other Topics Concern  . Not on file   Social History Narrative    ROS:  Pertinent items are noted in HPI.  PHYSICAL EXAMINATION:    BP 136/74 mmHg  Pulse 90  Ht 5\' 5"  (1.651 m)  Wt 241 lb (109.317 kg)  BMI 40.10 kg/m2  LMP 08/13/2008 (Approximate)      General appearance: alert, cooperative and appears stated age Head: Normocephalic, without obvious abnormality, atraumatic Neck: no adenopathy, supple, symmetrical, trachea midline and thyroid normal to inspection and palpation Lungs: clear to auscultation bilaterally   Heart: regular rate and rhythm Abdomen: soft, non-tender; bowel sounds normal; no masses,  no organomegaly Extremities: extremities normal, atraumatic, no cyanosis or edema Skin: Skin color, texture, turgor normal. No rashes or lesions Lymph nodes: Cervical, supraclavicular, and axillary nodes normal. No abnormal inguinal nodes palpated Neurologic: Grossly normal  Pelvic: External genitalia:  no lesions              Urethra:  normal appearing urethra with no masses, tenderness or lesions              Bartholins and Skenes: normal                 Vagina: normal appearing vagina with normal color and discharge, no lesions              Cervix: no lesions               Chaperone was present for exam.  Pelvic ultrasound images and report reviewed with patient.  Uterus - no masses. EMS - 4.94 mm. Ovaries - right ovary increased volume and left ovary normal size.  No adnexal masses. Free fluid - yes    Procedure - sonohysterogram Consent performed. Speculum placed in vagina. Sterile prep of cervix with  Hibiclens. Cannula placed inside endometrial cavity without difficulty. Speculum removed. Sterile saline injected.    2          filling defect noted:  11 mm and 16 mm. Cannula removed. No complication.   ASSESSMENT  Endometrial polyps confirmed on prior biopsy.  Right ovary larger than left but no masses and no free fluid. Hx breast cancer.  On Arimidex.  PLAN  Discussion of endometrial polyps.  Discussion of hysteroscopy with Myosure polypectomy, dilation and curettage.  Risks, benefits, and alternatives reviewed. Risks include but are not limited to bleeding, infection, damage to surrounding organs  including uterine perforation requiring hospitalization and laparoscopy, pulmonary edema, reaction to anesthesia, DVT, PE, death, need for further treatment and surgery including repeat hysteroscopy, or other surgical or medical therapy. Surgical expectations and recovery discussed.  Patient wishes to proceed.  _____25__ minutes face to face time of which over 50% was spent in counseling.   After visit summary  to patient.

## 2015-10-26 ENCOUNTER — Telehealth: Payer: Self-pay | Admitting: Obstetrics and Gynecology

## 2015-10-26 NOTE — Telephone Encounter (Signed)
Patient calling to schedule surgery. She is aware we will get back to her as soon as we have more information for her. Patient saw Dr. Quincy Simmonds yesterday.  Cc: Gay Filler for [employer].

## 2015-10-30 NOTE — Telephone Encounter (Signed)
Call to patient to review available surgery date options. Patient prefers 11-13-15 if can be arranged. Advised will proceed with scheduling and contact her back.

## 2015-10-31 ENCOUNTER — Telehealth: Payer: Self-pay | Admitting: Obstetrics and Gynecology

## 2015-10-31 NOTE — Telephone Encounter (Signed)
Spoke with pt regarding benefit for surgery. Patient understood and agreeable. Patient is scheduled for surgery on 11/13/15. Patient paid full benefits over the phone. Patient aware this is professional benefit only. Patient aware will be contacted by hospital for separate benefits.

## 2015-10-31 NOTE — Telephone Encounter (Signed)
Call to patient. Advised surgery is scheduled for 11-13-15 at 1120 at [facility] arrival at 1045 am. Hospital will confirm. Surgery instruction sheet reviewed and printed copy mailed to patient. See copy scanned to chart.  Routing to provider for final review. Patient agreeable to disposition. Will close encounter.

## 2015-11-02 ENCOUNTER — Other Ambulatory Visit: Payer: Self-pay

## 2015-11-02 ENCOUNTER — Encounter (HOSPITAL_COMMUNITY): Payer: Self-pay

## 2015-11-02 ENCOUNTER — Encounter (HOSPITAL_COMMUNITY)
Admission: RE | Admit: 2015-11-02 | Discharge: 2015-11-02 | Disposition: A | Discharge status: Home / Self Care | Payer: Medicare Other | Source: Ambulatory Visit | Attending: Obstetrics and Gynecology | Admitting: Obstetrics and Gynecology

## 2015-11-02 DIAGNOSIS — Z01812 Encounter for preprocedural laboratory examination: Secondary | ICD-10-CM | POA: Insufficient documentation

## 2015-11-02 DIAGNOSIS — Z01810 Encounter for preprocedural cardiovascular examination: Secondary | ICD-10-CM | POA: Insufficient documentation

## 2015-11-02 DIAGNOSIS — E119 Type 2 diabetes mellitus without complications: Secondary | ICD-10-CM | POA: Diagnosis not present

## 2015-11-02 NOTE — Pre-Procedure Instructions (Signed)
Patient had abnormal EKG today at PAT appointment. Spoke with Dr. Glennon Mac and no new orders received.

## 2015-11-02 NOTE — Patient Instructions (Addendum)
Your procedure is scheduled on: November 13, 2015   Enter through the Main Entrance of [facility] at: 11:00 am   Pick up the phone at the desk and dial [phone].  Call this number if you have problems the morning of surgery: [phone].  Remember: Do NOT eat food: after midnight on Monday night  Do NOT drink clear liquids after: 8:30 am day of surgery  Take these medicines the morning of surgery with a SIP OF WATER: anastrozole, Lisinopril, and Ativan is needed  DO NOT TAKE METFORMIN 24 HOURS PRIOR TO SURGERY  Take half of your insulin the night before surgery   Do NOT wear jewelry (body piercing), metal hair clips/bobby pins, make-up, or nail polish. Do NOT wear lotions, powders, or perfumes.  You may wear deoderant. Do NOT shave for 48 hours prior to surgery. Do NOT bring valuables to the hospital. Contacts, dentures, or bridgework may not be worn into surgery.  Have a responsible adult drive you home and stay with you for 24 hours after your procedure

## 2015-11-12 NOTE — H&P (Signed)
Nunzio Cobbs, MD at 10/25/2015 11:33 AM     Status: Signed       Expand All Collapse All   GYNECOLOGY VISIT  HPI:  65 y.o. G66P1001 Married Caucasian female with LMP 08/13/2008 here for sonohysterogram evaluation of thickened cystic endometrium and EMB showing benign endocervical versus endometrial polyp. Ultrasound originally ordered for bilateral pelvic pain and personal concern for cancer. No postmenopausal bleeding.  No hormonal therapy.  On Arimidex for breast cancer.  GYNECOLOGIC HISTORY: Patient's last menstrual period was 08/13/2008 (approximate). Contraception: Postmenopausal. Menopausal hormone therapy: none. On Arimidex. Last mammogram: 03/09/15 - BI-RADS2. Last pap smear: 05/03/15 - WNL, negative HR HPV.    OB History    Gravida Para Term Preterm AB TAB SAB Ectopic Multiple Living   1 1 1  0 0 0 0 0 0 1      Obstetric Comments   70 when had her son 89 age of first period 62 bcp for a short time 60 age of last period in 2009       Patient Active Problem List   Diagnosis Date Noted  . Breast cancer of upper-outer quadrant of right female breast ([place]) 08/03/2013  . Controlled diabetes mellitus type II without complication ([place]) [id number]  . Postmenopausal estrogen deficiency 05/02/2013    Past Medical History  Diagnosis Date  . Breast cancer ([place])     right side, er 86% +, PR 100%  . Diabetes mellitus without complication ([place])   . Hypertension     sees Dr. Verdis Prime [phone]  . Sleep apnea     "has not used cpap in a long time", sleep study over 5 years ago  . Anxiety     Past Surgical History  Procedure Laterality Date  . Colonoscopy    . Breast lumpectomy with needle localization and axillary sentinel lymph node bx Right 12/10/2012    Procedure: BREAST LUMPECTOMY WITH NEEDLE LOCALIZATION AND AXILLARY SENTINEL LYMPH NODE BX;  Surgeon: Harl Bowie, MD; Location: [place]; Service: General; Laterality: Right;  . Breast surgery  11/2012    Rt. lumpectomy--    Current Outpatient Prescriptions  Medication Sig Dispense Refill  . anastrozole (ARIMIDEX) 1 MG tablet Take 1 tablet (1 mg total) by mouth daily. 90 tablet 12  . BD PEN NEEDLE NANO U/F 32G X 4 MM MISC   1  . fish oil-omega-3 fatty acids 1000 MG capsule Take 1 g by mouth 2 (two) times daily.     Marland Kitchen FREESTYLE LITE test strip 3 (three) times daily. for testing  1  . glipiZIDE (GLUCOTROL) 10 MG tablet Take 20 mg by mouth 2 (two) times daily before a meal.    . Lancets (FREESTYLE) lancets     . LANTUS SOLOSTAR 100 UNIT/ML Solostar Pen     . lisinopril (PRINIVIL,ZESTRIL) 20 MG tablet Take 20 mg by mouth daily.    Marland Kitchen LORazepam (ATIVAN) 0.5 MG tablet 0.5 mg as needed.     . metFORMIN (GLUCOPHAGE) 500 MG tablet Take 1,000 mg by mouth 2 (two) times daily with a meal.    . Multiple Vitamin (MULTIVITAMIN WITH MINERALS) TABS Take 1 tablet by mouth daily.    . vitamin E 1000 UNIT capsule Take 1,000 Units by mouth daily.     No current facility-administered medications for this visit.     ALLERGIES: Review of patient's allergies indicates no known allergies.  Family History  Problem Relation Age of Onset  . Heart attack Father 26  deceased  . Kidney failure Mother     dec 75  . Diabetes Mother   . Cancer Brother 56    Dec lung ca  . Cancer Brother 72    Dec lung ca  . Thyroid disease Sister     Social History   Social History  . Marital Status: Married    Spouse Name: N/A  . Number of Children: 1  . Years of Education: N/A   Occupational History  . Not on file.   Social History Main Topics  . Smoking status: Never Smoker   . Smokeless tobacco: Not on file  . Alcohol Use: No  . Drug Use:  No  . Sexual Activity:    Partners: Male    Birth Control/ Protection: Post-menopausal   Other Topics Concern  . Not on file   Social History Narrative    ROS: Pertinent items are noted in HPI.  PHYSICAL EXAMINATION:   BP 136/74 mmHg  Pulse 90  Ht 5\' 5"  (1.651 m)  Wt 241 lb (109.317 kg)  BMI 40.10 kg/m2  LMP 08/13/2008 (Approximate)  General appearance: alert, cooperative and appears stated age Head: Normocephalic, without obvious abnormality, atraumatic Neck: no adenopathy, supple, symmetrical, trachea midline and thyroid normal to inspection and palpation Lungs: clear to auscultation bilaterally  Heart: regular rate and rhythm Abdomen: soft, non-tender; bowel sounds normal; no masses, no organomegaly Extremities: extremities normal, atraumatic, no cyanosis or edema Skin: Skin color, texture, turgor normal. No rashes or lesions Lymph nodes: Cervical, supraclavicular, and axillary nodes normal. No abnormal inguinal nodes palpated Neurologic: Grossly normal  Pelvic: External genitalia: no lesions  Urethra: normal appearing urethra with no masses, tenderness or lesions  Bartholins and Skenes: normal   Vagina: normal appearing vagina with normal color and discharge, no lesions  Cervix: no lesions   Chaperone was present for exam.  Pelvic ultrasound images and report reviewed with patient.  Uterus - no masses. EMS - 4.94 mm. Ovaries - right ovary increased volume and left ovary normal size. No adnexal masses. Free fluid - yes    Procedure - sonohysterogram Consent performed. Speculum placed in vagina. Sterile prep of cervix with Hibiclens. Cannula placed inside endometrial cavity without difficulty. Speculum removed. Sterile saline injected.  2 filling defect noted: 11 mm and 16 mm. Cannula removed. No complication.   ASSESSMENT  Endometrial polyps  confirmed on prior biopsy.  Right ovary larger than left but no masses and no free fluid. Hx breast cancer. On Arimidex.  PLAN  Discussion of endometrial polyps.  Discussion of hysteroscopy with Myosure polypectomy, dilation and curettage.  Risks, benefits, and alternatives reviewed. Risks include but are not limited to bleeding, infection, damage to surrounding organs including uterine perforation requiring hospitalization and laparoscopy, pulmonary edema, reaction to anesthesia, DVT, PE, death, need for further treatment and surgery including repeat hysteroscopy, or other surgical or medical therapy. Surgical expectations and recovery discussed.  Patient wishes to proceed.  _____25__ minutes face to face time of which over 50% was spent in counseling.   After visit summary to patient.

## 2015-11-13 ENCOUNTER — Encounter (HOSPITAL_COMMUNITY): Payer: Self-pay | Admitting: *Deleted

## 2015-11-13 ENCOUNTER — Ambulatory Visit (HOSPITAL_COMMUNITY): Payer: Medicare Other | Admitting: Anesthesiology

## 2015-11-13 ENCOUNTER — Ambulatory Visit (HOSPITAL_COMMUNITY)
Admission: RE | Admit: 2015-11-13 | Discharge: 2015-11-13 | Disposition: A | Discharge status: Home / Self Care | Payer: Medicare Other | Source: Ambulatory Visit | Attending: Obstetrics and Gynecology | Admitting: Obstetrics and Gynecology

## 2015-11-13 ENCOUNTER — Encounter (HOSPITAL_COMMUNITY)
Admission: RE | Disposition: A | Discharge status: Home / Self Care | Payer: Self-pay | Source: Ambulatory Visit | Attending: Obstetrics and Gynecology

## 2015-11-13 DIAGNOSIS — E119 Type 2 diabetes mellitus without complications: Secondary | ICD-10-CM | POA: Diagnosis not present

## 2015-11-13 DIAGNOSIS — I10 Essential (primary) hypertension: Secondary | ICD-10-CM | POA: Insufficient documentation

## 2015-11-13 DIAGNOSIS — N840 Polyp of corpus uteri: Secondary | ICD-10-CM | POA: Diagnosis present

## 2015-11-13 DIAGNOSIS — Z6841 Body Mass Index (BMI) 40.0 and over, adult: Secondary | ICD-10-CM | POA: Diagnosis not present

## 2015-11-13 DIAGNOSIS — Z853 Personal history of malignant neoplasm of breast: Secondary | ICD-10-CM | POA: Diagnosis not present

## 2015-11-13 HISTORY — PX: DILATATION & CURETTAGE/HYSTEROSCOPY WITH MYOSURE: SHX6511

## 2015-11-13 LAB — GLUCOSE, CAPILLARY
GLUCOSE-CAPILLARY: 182 mg/dL — AB (ref 65–99)
GLUCOSE-CAPILLARY: 161 mg/dL — AB (ref 65–99)

## 2015-11-13 SURGERY — DILATATION & CURETTAGE/HYSTEROSCOPY WITH MYOSURE
Anesthesia: General | Site: Vagina

## 2015-11-13 MED ORDER — LIDOCAINE HCL (CARDIAC) 20 MG/ML IV SOLN
INTRAVENOUS | Status: AC
  Filled 2015-11-13: qty 5

## 2015-11-13 MED ORDER — HYDROMORPHONE HCL 1 MG/ML IJ SOLN: 0.2500 mg | INTRAMUSCULAR | Status: DC | PRN

## 2015-11-13 MED ORDER — DEXAMETHASONE SODIUM PHOSPHATE 10 MG/ML IJ SOLN
INTRAMUSCULAR | Status: DC | PRN
  Administered 2015-11-13: 4 mg via INTRAVENOUS

## 2015-11-13 MED ORDER — ONDANSETRON HCL 4 MG/2ML IJ SOLN
INTRAMUSCULAR | Status: DC | PRN
  Administered 2015-11-13: 4 mg via INTRAVENOUS

## 2015-11-13 MED ORDER — EPHEDRINE 5 MG/ML INJ
INTRAVENOUS | Status: AC
  Filled 2015-11-13: qty 10

## 2015-11-13 MED ORDER — CEFAZOLIN SODIUM-DEXTROSE 2-3 GM-% IV SOLR
INTRAVENOUS | Status: AC
  Filled 2015-11-13: qty 50

## 2015-11-13 MED ORDER — LIDOCAINE HCL (CARDIAC) 20 MG/ML IV SOLN
INTRAVENOUS | Status: DC | PRN
  Administered 2015-11-13: 80 mg via INTRAVENOUS

## 2015-11-13 MED ORDER — KETOROLAC TROMETHAMINE 30 MG/ML IJ SOLN
INTRAMUSCULAR | Status: AC
  Filled 2015-11-13: qty 1

## 2015-11-13 MED ORDER — OXYCODONE HCL 5 MG/5ML PO SOLN: 5.0000 mg | Freq: Once | ORAL | Status: DC | PRN

## 2015-11-13 MED ORDER — FENTANYL CITRATE (PF) 100 MCG/2ML IJ SOLN
INTRAMUSCULAR | Status: DC | PRN
  Administered 2015-11-13 (×2): 25 ug via INTRAVENOUS

## 2015-11-13 MED ORDER — IBUPROFEN 800 MG PO TABS: 800.0000 mg | ORAL_TABLET | Freq: Three times a day (TID) | ORAL | Status: DC | PRN

## 2015-11-13 MED ORDER — DEXAMETHASONE SODIUM PHOSPHATE 4 MG/ML IJ SOLN
INTRAMUSCULAR | Status: AC
  Filled 2015-11-13: qty 1

## 2015-11-13 MED ORDER — ONDANSETRON HCL 4 MG/2ML IJ SOLN
INTRAMUSCULAR | Status: AC
  Filled 2015-11-13: qty 2

## 2015-11-13 MED ORDER — PROPOFOL 10 MG/ML IV BOLUS
INTRAVENOUS | Status: DC | PRN
  Administered 2015-11-13: 160 mg via INTRAVENOUS

## 2015-11-13 MED ORDER — LIDOCAINE HCL 1 % IJ SOLN
INTRAMUSCULAR | Status: DC | PRN
  Administered 2015-11-13: 10 mL

## 2015-11-13 MED ORDER — MEPERIDINE HCL 25 MG/ML IJ SOLN: 6.2500 mg | INTRAMUSCULAR | Status: DC | PRN

## 2015-11-13 MED ORDER — SODIUM CHLORIDE 0.9 % IR SOLN
Status: DC | PRN
  Administered 2015-11-13: 3000 mL

## 2015-11-13 MED ORDER — CEFAZOLIN SODIUM-DEXTROSE 2-3 GM-% IV SOLR
2.0000 g | INTRAVENOUS | Status: AC
  Administered 2015-11-13: 2 g via INTRAVENOUS

## 2015-11-13 MED ORDER — PROPOFOL 10 MG/ML IV BOLUS
INTRAVENOUS | Status: AC
  Filled 2015-11-13: qty 20

## 2015-11-13 MED ORDER — LIDOCAINE HCL 1 % IJ SOLN
INTRAMUSCULAR | Status: AC
  Filled 2015-11-13: qty 20

## 2015-11-13 MED ORDER — OXYCODONE HCL 5 MG PO TABS: 5.0000 mg | ORAL_TABLET | Freq: Once | ORAL | Status: DC | PRN

## 2015-11-13 MED ORDER — LACTATED RINGERS IV SOLN
INTRAVENOUS | Status: DC
Start: 2015-11-13 — End: 2015-11-13
  Administered 2015-11-13 (×2): via INTRAVENOUS

## 2015-11-13 MED ORDER — LACTATED RINGERS IV SOLN: INTRAVENOUS | Status: DC

## 2015-11-13 MED ORDER — DIPHENHYDRAMINE HCL 50 MG/ML IJ SOLN
INTRAMUSCULAR | Status: DC | PRN
  Administered 2015-11-13: 12.5 mg via INTRAVENOUS

## 2015-11-13 MED ORDER — FENTANYL CITRATE (PF) 100 MCG/2ML IJ SOLN
INTRAMUSCULAR | Status: AC
  Filled 2015-11-13: qty 2

## 2015-11-13 MED ORDER — KETOROLAC TROMETHAMINE 30 MG/ML IJ SOLN
INTRAMUSCULAR | Status: DC | PRN
  Administered 2015-11-13: 15 mg via INTRAVENOUS

## 2015-11-13 SURGICAL SUPPLY — 20 items
CANISTER SUCT 3000ML (MISCELLANEOUS) ×2 IMPLANT
CATH ROBINSON RED A/P 16FR (CATHETERS) ×2 IMPLANT
CLOTH BEACON ORANGE TIMEOUT ST (SAFETY) ×2 IMPLANT
CONTAINER PREFILL 10% NBF 60ML (FORM) ×4 IMPLANT
DEVICE MYOSURE LITE (MISCELLANEOUS) ×2 IMPLANT
DEVICE MYOSURE REACH (MISCELLANEOUS) IMPLANT
ELECT REM PT RETURN 9FT ADLT (ELECTROSURGICAL)
ELECTRODE REM PT RTRN 9FT ADLT (ELECTROSURGICAL) IMPLANT
FILTER ARTHROSCOPY CONVERTOR (FILTER) ×2 IMPLANT
GLOVE BIO SURGEON STRL SZ 6.5 (GLOVE) ×2 IMPLANT
GLOVE BIOGEL PI IND STRL 7.0 (GLOVE) ×2 IMPLANT
GLOVE BIOGEL PI INDICATOR 7.0 (GLOVE) ×2
GOWN STRL REUS W/TWL LRG LVL3 (GOWN DISPOSABLE) ×4 IMPLANT
PACK VAGINAL MINOR WOMEN LF (CUSTOM PROCEDURE TRAY) ×2 IMPLANT
PAD OB MATERNITY 4.3X12.25 (PERSONAL CARE ITEMS) ×2 IMPLANT
SEAL ROD LENS SCOPE MYOSURE (ABLATOR) ×2 IMPLANT
TOWEL OR 17X24 6PK STRL BLUE (TOWEL DISPOSABLE) ×4 IMPLANT
TUBING AQUILEX INFLOW (TUBING) ×2 IMPLANT
TUBING AQUILEX OUTFLOW (TUBING) ×2 IMPLANT
WATER STERILE IRR 1000ML POUR (IV SOLUTION) ×2 IMPLANT

## 2015-11-13 NOTE — Op Note (Addendum)
OPERATIVE REPORT   PREOPERATIVE DIAGNOSES:   Endometrial polyps, history of breast cancer  POSTOPERATIVE DIAGNOSES:   Endometrial polyps, history of breast cancer  PROCEDURE:  Hysteroscopy with dilation and curettage and Myosure resection of endometrial polyps  SURGEON:  Lenard Galloway, MD  ANESTHESIA:  LMA, paracervical block with 10 mL of 1% lidocaine.  IV FLUIDS:   ---  EBL:  15 cc.  URINE OUTPUT: 50 cc.  NORMAL SALINE DEFICIT:   120 cc.  COMPLICATIONS:  None.  INDICATIONS FOR THE PROCEDURE:     The patient is a 65 y.o. G45P1001 Married Caucasian female with LMP 08/13/2008 who presents with endometrial masses measuring 11 mm and 16 mm on sonohysterogram. The patient had a pelvic ultrasound showing thickened cystic endometrium, and an EMB demonstrated a benign endocervical versus endometrial polyp. The ultrasound was originally ordered for bilateral pelvic pain and personal concern for cancer. The patient has not had any postmenopausal bleeding. She is currently taking  Arimidex for breast cancer.  A plan is now made to proceed with a hysteroscopy with dilation and curettage and Myosure resection of endometrial polyps; after risks, benefits and alternatives were reviewed.  FINDINGS:  Exam under anesthesia revealed a small uterus and no adnexal masses. The uterus was sounded to 7 cm. Hysteroscopy showed two posterior fundal polyps measuring 1 cm each.  There was a third 0.5 cm polyp in the right cornual region. Endometrial currettings were scanty. There was no evidence of fibroids in the uterine cavity.   SPECIMENS:  The endometrial polyps and endometrial curettings were sent to Pathology as two separate specimens.  PROCEDURE IN DETAIL:  The patient was reidentified in the preoperative hold area.  She did receive Ancef IV for antibiotic prophylaxis and she received TED hose and PAS stockings for DVT prophylaxis.  In the operating room, the patient  was placed in the dorsal lithotomy position and then an LMA anesthetic was introduced.  The patient's lower abdomen, vagina and perineum were sterilely prepped with Betadine and the  patient's bladder was catheterized of urine.  She was sterilly draped.  An exam under anesthesia was performed.  A speculum was placed inside the vagina and a single-tooth tenaculum was placed on the anterior cervical lip.  A paracervical block was performed with a total of 10 mL of 1% lidocaine plain.  The uterus was sounded. The cervix was dilated to a #23 Pratt dilator.  The MyoSure hysteroscope was then inserted inside the uterine cavity under the continuous infusion of normal saline solution.  The findings are as noted above.  The polyps were completely ressected. The MyoSure hysteroscope was removed, and the specimen was sent to pathology. The cervix was further dilated to a #25 Pratt dilator.  The sharp and then a serrated curette were introduced into the uterine cavity and the endometrium was curetted in all 4 quadrants.  A minimal amount of endometrial curettings was obtained.  This specimen was sent to Pathology.  The single-tooth tenaculum which had been placed on the anterior cervical lip was removed.  Hemostasis was good, and all of the vaginal instruments were removed.  The patient was awakened and escorted to the recovery room in stable condition after she was cleansed with Betadine.  There were no complications to the procedure.  All needle, instrument and sponge counts were correct.  Lenard Galloway, MD

## 2015-11-13 NOTE — Anesthesia Postprocedure Evaluation (Signed)
Anesthesia Post Note  Patient: Linda Boyer  Procedure(s) Performed: Procedure(s) (LRB): DILATATION & CURETTAGE/HYSTEROSCOPY WITH MYOSURE (N/A)  Patient location during evaluation: PACU Anesthesia Type: General Level of consciousness: awake and alert Pain management: pain level controlled Vital Signs Assessment: post-procedure vital signs reviewed and stable Respiratory status: spontaneous breathing, nonlabored ventilation and respiratory function stable Cardiovascular status: blood pressure returned to baseline and stable Postop Assessment: no signs of nausea or vomiting Anesthetic complications: no    Last Vitals:  Filed Vitals:   11/13/15 1052  BP: 159/90  Pulse: 88  Temp: 36.4 C    Last Pain: There were no vitals filed for this visit.               Lajarvis Italiano A

## 2015-11-13 NOTE — Anesthesia Procedure Notes (Signed)
Procedure Name: LMA Insertion Date/Time: 11/13/2015 12:31 PM Performed by: Raenette Rover Pre-anesthesia Checklist: Patient identified, Emergency Drugs available, Suction available and Patient being monitored Patient Re-evaluated:Patient Re-evaluated prior to inductionOxygen Delivery Method: Circle system utilized Preoxygenation: Pre-oxygenation with 100% oxygen Intubation Type: IV induction Ventilation: Mask ventilation without difficulty LMA: LMA inserted LMA Size: 4.0 Number of attempts: 2 Placement Confirmation: positive ETCO2,  CO2 detector and breath sounds checked- equal and bilateral Tube secured with: Tape Dental Injury: Teeth and Oropharynx as per pre-operative assessment

## 2015-11-13 NOTE — Discharge Instructions (Signed)
Hysteroscopy, Care After °Refer to this sheet in the next few weeks. These instructions provide you with information on caring for yourself after your procedure. Your health care provider may also give you more specific instructions. Your treatment has been planned according to current medical practices, but problems sometimes occur. Call your health care provider if you have any problems or questions after your procedure.  °WHAT TO EXPECT AFTER THE PROCEDURE °After your procedure, it is typical to have the following: °· You may have some cramping. This normally lasts for a couple days. °· You may have bleeding. This can vary from light spotting for a few days to menstrual-like bleeding for 3-7 days. °HOME CARE INSTRUCTIONS °· Rest for the first 1-2 days after the procedure. °· Only take over-the-counter or prescription medicines as directed by your health care provider. Do not take aspirin. It can increase the chances of bleeding. °· Take showers instead of baths for 2 weeks or as directed by your health care provider. °· Do not drive for 24 hours or as directed. °· Do not drink alcohol while taking pain medicine. °· Do not use tampons, douche, or have sexual intercourse for 2 weeks or until your health care provider says it is okay. °· Take your temperature twice a day for 4-5 days. Write it down each time. °· Follow your health care provider's advice about diet, exercise, and lifting. °· If you develop constipation, you may: °¨ Take a mild laxative if your health care provider approves. °¨ Add bran foods to your diet. °¨ Drink enough fluids to keep your urine clear or pale yellow. °· Try to have someone with you or available to you for the first 24-48 hours, especially if you were given a general anesthetic. °· Follow up with your health care provider as directed. °SEEK MEDICAL CARE IF: °· You feel dizzy or lightheaded. °· You feel sick to your stomach (nauseous). °· You have abnormal vaginal discharge. °· You  have a rash. °· You have pain that is not controlled with medicine. °SEEK IMMEDIATE MEDICAL CARE IF: °· You have bleeding that is heavier than a normal menstrual period. °· You have a fever. °· You have increasing cramps or pain, not controlled with medicine. °· You have new belly (abdominal) pain. °· You pass out. °· You have pain in the tops of your shoulders (shoulder strap areas). °· You have shortness of breath. °  °This information is not intended to replace advice given to you by your health care provider. Make sure you discuss any questions you have with your health care provider. °  °Document Released: 07/20/2013 Document Reviewed: 07/20/2013 °Elsevier Interactive Patient Education ©2016 [employer] °DISCHARGE INSTRUCTIONS: D&C / D&E °The following instructions have been prepared to help you care for yourself upon your return home. °  °Personal hygiene: °• Use sanitary pads for vaginal drainage, not tampons. °• Shower the day after your procedure. °• NO tub baths, pools or Jacuzzis for 2-3 weeks. °• Wipe front to back after using the bathroom. ° °Activity and limitations: °• Do NOT drive or operate any equipment for 24 hours. The effects of anesthesia are still present and drowsiness may result. °• Do NOT rest in bed all day. °• Walking is encouraged. °• Walk up and down stairs slowly. °• You may resume your normal activity in one to two days or as indicated by your physician. ° °Sexual activity: NO intercourse for at least 2 weeks after the procedure, or as indicated by   your physician. ° °Diet: Eat a light meal as desired this evening. You may resume your usual diet tomorrow. ° °Return to work: You may resume your work activities in one to two days or as indicated by your doctor. ° °What to expect after your surgery: Expect to have vaginal bleeding/discharge for 2-3 days and spotting for up to 10 days. It is not unusual to have soreness for up to 1-2 weeks. You may have a slight burning sensation when  you urinate for the first day. Mild cramps may continue for a couple of days. You may have a regular period in 2-6 weeks. ° °Call your doctor for any of the following: °• Excessive vaginal bleeding, saturating and changing one pad every hour. °• Inability to urinate 6 hours after discharge from hospital. °• Pain not relieved by pain medication. °• Fever of 100.4° F or greater. °• Unusual vaginal discharge or odor. ° ° Call for an appointment:  ° ° °Patient’s signature: ______________________ ° °Nurse’s signature ________________________ ° °Support person's signature_______________________ ° ° ° °

## 2015-11-13 NOTE — Brief Op Note (Signed)
11/13/2015  1:06 PM  PATIENT:  Linda Boyer  65 y.o. female  PRE-OPERATIVE DIAGNOSIS:  endometrial polyps, history of breast cancer  POST-OPERATIVE DIAGNOSIS:  endometrial polyps, history of breast cancer  PROCEDURE:  Procedure(s): DILATATION & CURETTAGE/HYSTEROSCOPY WITH MYOSURE (N/A)  SURGEON:  Surgeon(s) and Role:    * Zayveon Raschke E Yisroel Ramming, MD - Primary  PHYSICIAN ASSISTANT: NA  ASSISTANTS: none   ANESTHESIA:   paracervical block and LMA.  EBL:     BLOOD ADMINISTERED:none  DRAINS: none   LOCAL MEDICATIONS USED:  LIDOCAINE  and Amount: 10 ml  SPECIMEN:  Source of Specimen:  endometrial polyps, endometrial curettings.  DISPOSITION OF SPECIMEN:  PATHOLOGY  COUNTS:  YES  TOURNIQUET:  * No tourniquets in log *  DICTATION: .Other Dictation: Dictation Number    PLAN OF CARE: Discharge to home after PACU  PATIENT DISPOSITION:  PACU - hemodynamically stable.   Delay start of Pharmacological VTE agent (>24hrs) due to surgical blood loss or risk of bleeding: no

## 2015-11-13 NOTE — Anesthesia Preprocedure Evaluation (Addendum)
Anesthesia Evaluation  Patient identified by MRN, date of birth, ID band Patient awake    Reviewed: Allergy & Precautions, NPO status , Patient's Chart, lab work & pertinent test results  History of Anesthesia Complications (+) PONV  Airway Mallampati: II  TM Distance: >3 FB Neck ROM: Full    Dental  (+) Teeth Intact, Dental Advisory Given   Pulmonary    breath sounds clear to auscultation       Cardiovascular hypertension, Pt. on medications  Rhythm:Regular Rate:Normal     Neuro/Psych    GI/Hepatic   Endo/Other  diabetes, Well Controlled, Type 2, Oral Hypoglycemic AgentsMorbid obesity  Renal/GU      Musculoskeletal   Abdominal   Peds  Hematology   Anesthesia Other Findings Pt claims to use CPAP most of the time and assures me she will use it for at least the next 3 days.  Her husband was present during this discussion and understood the need for CPAP use.  Reproductive/Obstetrics                            Anesthesia Physical Anesthesia Plan  ASA: III  Anesthesia Plan: General   Post-op Pain Management:    Induction: Intravenous  Airway Management Planned: LMA  Additional Equipment:   Intra-op Plan:   Post-operative Plan: Extubation in OR  Informed Consent: I have reviewed the patients History and Physical, chart, labs and discussed the procedure including the risks, benefits and alternatives for the proposed anesthesia with the patient or authorized representative who has indicated his/her understanding and acceptance.   Dental advisory given  Plan Discussed with: CRNA, Anesthesiologist and Surgeon  Anesthesia Plan Comments:         Anesthesia Quick Evaluation

## 2015-11-13 NOTE — Progress Notes (Signed)
Update to History and Physical  No marked change in status since office preop visit.   Patient examined.   Ok to proceed.  

## 2015-11-13 NOTE — Transfer of Care (Signed)
Immediate Anesthesia Transfer of Care Note  Patient: Linda Boyer  Procedure(s) Performed: Procedure(s): DILATATION & CURETTAGE/HYSTEROSCOPY WITH MYOSURE (N/A)  Patient Location: PACU  Anesthesia Type:General  Level of Consciousness: awake, alert , oriented and patient cooperative  Airway & Oxygen Therapy: Patient Spontanous Breathing and Patient connected to nasal cannula oxygen  Post-op Assessment: Report given to RN and Post -op Vital signs reviewed and stable  Post vital signs: Reviewed and stable  Last Vitals:  Filed Vitals:   11/13/15 1052  BP: 159/90  Pulse: 88  Temp: [id number] C    Complications: No apparent anesthesia complications

## 2015-11-14 ENCOUNTER — Telehealth: Payer: Self-pay | Admitting: Obstetrics and Gynecology

## 2015-11-14 ENCOUNTER — Encounter (HOSPITAL_COMMUNITY): Payer: Self-pay | Admitting: Obstetrics and Gynecology

## 2015-11-14 NOTE — Telephone Encounter (Signed)
Phone call to patient at home to discuss pathology report.  No answer.  Mail box not set up yet.  Phone call automatically disconnected, so no information was imparted to patient.   Pathology report from hysteroscopic resection of polyps and dilation and curettage showed simple and complex endometrial hyperplasia.  There was no sign of endometrial cancer.   Patient is currently on Arimidex for her breast cancer therapy.   I will need to meet with her about the reports, and plan for treatment, which will be hysterectomy and removal of tubes and ovaries.   She has an appointment for her post op in 2 weeks, but I would like for her to have this information sooner if possible.

## 2015-11-14 NOTE — Telephone Encounter (Signed)
Call to patient. Voicemail box not set up. Will have to try again.

## 2015-11-16 NOTE — Telephone Encounter (Signed)
Call again to patient. Discussed results from Dr. Quincy Simmonds.  Questions answered for patient. Patient verbalized understanding of results.  Advised of treatment plan as recommended from Dr. Quincy Simmonds. Patient agreeable to consult with Dr. Quincy Simmonds, scheduled for this Monday 11/19/15 at 1300.  Did not cancel patient's 2 week post op at this time.

## 2015-11-19 ENCOUNTER — Encounter: Payer: Self-pay | Admitting: Obstetrics and Gynecology

## 2015-11-19 ENCOUNTER — Ambulatory Visit (INDEPENDENT_AMBULATORY_CARE_PROVIDER_SITE_OTHER): Payer: Medicare Other | Admitting: Obstetrics and Gynecology

## 2015-11-19 VITALS — BP 138/80 | HR 84 | Resp 18 | Ht 65.0 in | Wt 249.0 lb

## 2015-11-19 DIAGNOSIS — N8501 Benign endometrial hyperplasia: Secondary | ICD-10-CM

## 2015-11-19 NOTE — Patient Instructions (Signed)
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

## 2015-11-19 NOTE — Progress Notes (Signed)
GYNECOLOGY  VISIT   HPI: 65 y.o.   Married  Caucasian  female   G1P1001 with Patient's last menstrual period was 08/13/2008 (approximate).   here for pathology results   Status post hysteroscopy with polypectomy, dilation and curettage. Pathology showed complex hyperplasia of the endometrium without atypia.   Hx of breast cancer.  On Arimidex.  Denies urinary incontinence.  GYNECOLOGIC HISTORY: Patient's last menstrual period was 08/13/2008 (approximate). Contraception: Post menopausal  Menopausal hormone therapy: None Last mammogram: 03/09/15 Diagnostic Bilateral BIRADS2:benign  Last pap smear: 05/03/15 Neg. HR HPV:neg        OB History    Gravida Para Term Preterm AB TAB SAB Ectopic Multiple Living   1 1 1  0 0 0 0 0 0 1      Obstetric Comments   89 when had her son 53 age of first period 30 bcp for a short time 49 age of last period in 2009         Patient Active Problem List   Diagnosis Date Noted  . Breast cancer of upper-outer quadrant of right female breast ([place]) 08/03/2013  . Controlled diabetes mellitus type II without complication ([place]) [id number]  . Postmenopausal estrogen deficiency 05/02/2013    Past Medical History  Diagnosis Date  . Breast cancer ([place])     right side, er 86% +, PR 100%  . Diabetes mellitus without complication ([place])   . Hypertension     sees Dr. Verdis Prime [phone]  . Sleep apnea     "has not used cpap in a long time", sleep study over 5 years ago  . Anxiety     Past Surgical History  Procedure Laterality Date  . Colonoscopy    . Breast lumpectomy with needle localization and axillary sentinel lymph node bx Right 12/10/2012    Procedure: BREAST LUMPECTOMY WITH NEEDLE LOCALIZATION AND AXILLARY SENTINEL LYMPH NODE BX;  Surgeon: Harl Bowie, MD;  Location: [facility];  Service: General;  Laterality: Right;  . Breast surgery  11/2012    Rt. lumpectomy--  . Dilatation & curettage/hysteroscopy with myosure N/A 11/13/2015     Procedure: DILATATION & CURETTAGE/HYSTEROSCOPY WITH MYOSURE;  Surgeon: Nunzio Cobbs, MD;  Location: [facility];  Service: Gynecology;  Laterality: N/A;    Current Outpatient Prescriptions  Medication Sig Dispense Refill  . anastrozole (ARIMIDEX) 1 MG tablet Take 1 tablet (1 mg total) by mouth daily. 90 tablet 12  . BD PEN NEEDLE NANO U/F 32G X 4 MM MISC   1  . fish oil-omega-3 fatty acids 1000 MG capsule Take 1 g by mouth 2 (two) times daily.     Marland Kitchen FREESTYLE LITE test strip 3 (three) times daily. for testing  1  . glipiZIDE (GLUCOTROL) 10 MG tablet Take 20 mg by mouth 2 (two) times daily before a meal.    . ibuprofen (ADVIL,MOTRIN) 800 MG tablet Take 1 tablet (800 mg total) by mouth every 8 (eight) hours as needed. 30 tablet 0  . Lancets (FREESTYLE) lancets     . LANTUS SOLOSTAR 100 UNIT/ML Solostar Pen Inject 50 Units into the skin 2 (two) times daily.     Marland Kitchen lisinopril (PRINIVIL,ZESTRIL) 20 MG tablet Take 20 mg by mouth daily.    Marland Kitchen LORazepam (ATIVAN) 0.5 MG tablet Take 0.5 mg by mouth as needed for anxiety.     . metFORMIN (GLUCOPHAGE) 500 MG tablet Take 500 mg by mouth 2 (two) times daily with a meal.     .  Multiple Vitamin (MULTIVITAMIN WITH MINERALS) TABS Take 1 tablet by mouth daily.    . vitamin E 1000 UNIT capsule Take 1,000 Units by mouth daily.     No current facility-administered medications for this visit.     ALLERGIES: Review of patient's allergies indicates no known allergies.  Family History  Problem Relation Age of Onset  . Heart attack Father 41    deceased  . Kidney failure Mother     dec 75  . Diabetes Mother   . Cancer Brother 65    Dec lung ca  . Cancer Brother 25    Dec lung ca  . Thyroid disease Sister     Social History   Social History  . Marital Status: Married    Spouse Name: N/A  . Number of Children: 1  . Years of Education: N/A   Occupational History  . Not on file.   Social History Main Topics  . Smoking status: Never Smoker    . Smokeless tobacco: Never Used  . Alcohol Use: No  . Drug Use: No  . Sexual Activity:    Partners: Male    Birth Control/ Protection: Post-menopausal   Other Topics Concern  . Not on file   Social History Narrative    ROS:  Pertinent items are noted in HPI.  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 Breast cancer.   Estrogen and progesterone receptor positive.  On Anastrozole.  PLAN  Counseled regarding endometrial hyperplasia - etiology, risk of endometrial cancer, and treatment options - Provera therapy, hysterectomy with removal of tubes and ovaries.  I do not recommend doing follow up endometrial biopsies to follow this as the patient had an office EMB that did not detect the hyperplasia.  I have discussed in detail a robotic total laparoscopic hysterectomy with bilateral salpingo-oophorectomy and cystoscopy.  Benefits and risks discussed.  Risks include but are not limited to:  bleeding, infection, damage to surrounding organs, reaction to anesthesia, pneumonia, DVT, PE, death, hernia formation, need for reoperation, and final pathology which could be negative and normal. Surgical expectations and recovery discussed. She has some social issues to deal with for her post op care as her husband is a Administrator and it will be difficult for him to be here for here surgery. Will do precert.   An After Visit Summary was printed and given to the patient.  _40_____ minutes face to face time of which over 50% was spent in counseling.

## 2015-11-21 ENCOUNTER — Telehealth: Payer: Self-pay | Admitting: Obstetrics and Gynecology

## 2015-11-21 NOTE — Telephone Encounter (Signed)
Spoke with patient regarding benefits for recommended surgical procedure. Patient wants to verify benefit information with hospital, before scheduling. Provided patient with the [place] at the hospital. Patient advises once she obtains information from the [facility], she will call back to schedule

## 2015-11-21 NOTE — Telephone Encounter (Signed)
Patient called to advised she will schedule procedure at a later date. Forward to clinical to reveiw

## 2015-11-26 ENCOUNTER — Ambulatory Visit: Payer: Medicare Other | Admitting: Obstetrics and Gynecology

## 2015-12-03 ENCOUNTER — Telehealth: Payer: Self-pay | Admitting: *Deleted

## 2015-12-03 NOTE — Telephone Encounter (Signed)
See previous phone encounter. Patient declined to schedule surgery. See account notes. Follow-up call to patient to check on plan for scheduling surgery. Patient states she will be able to provide an update on plan in 2.5 weeks after she gets second opinion.  States she has appointment on March 7 with physician in [facility] and she spoke to [place] about sending records. She has also spoke to PCP about surgery who states that cant fully advise without pathology report but "hysterectomy might not be necessary."  Patient became increasingly upset that Dr Quincy Simmonds recommended hysterectomy in able to continue to take care of her. She states that we were "only in this for the money" and at this point I did advise patient I agreed with her that we may not be the best place for her since that was in no way our intention. In attempt to call practice administrator to assist with call, accidentally disconnected call. Thayer Ohm, practice administrator called patient right back. Apologized for disconnection, advised patient how that occurred.Patient now  States she never said Dr Quincy Simmonds only cared about the money but she has issues with credit on her account not being refunded, now being asked for surgery deposit for surgery she "may not need." Seth Bake reviewed account information, advised now that all pending claims have processed, will issue refund. Patient has appointment for 2nd opinion and has requested records be transferred.  Routing to provider.

## 2015-12-03 NOTE — Telephone Encounter (Signed)
See next phone note for follow-up . Routing to provider for final review.  Will close encounter.

## 2015-12-20 ENCOUNTER — Encounter: Payer: Self-pay | Admitting: Obstetrics and Gynecology

## 2015-12-20 ENCOUNTER — Telehealth: Payer: Self-pay | Admitting: Obstetrics and Gynecology

## 2015-12-20 NOTE — Telephone Encounter (Signed)
Patient has been appropriately counseled regarding her complex endometrial hyperplasia. She has sought a second opinion. I have closed the encounter.

## 2015-12-20 NOTE — Telephone Encounter (Signed)
Left message for patient to call office with correct address to send requested medical records.

## 2016-03-16 NOTE — Progress Notes (Signed)
ID: Linda Boyer   DOB: 1951/08/18  MR#: [id number]  UTM#:[id number]  PCP: Verdis Prime GYN:  Josefa Half SU: Coralie Keens OTHER MD: Arloa Koh  CHIEF COMPLAINT: Early stage estrogen receptor positive breast cancer  CURRENT TREATMENT: Anastrozole   BREAST CANCERHISTORY: From the original intake note:  Ms. Blas originally presented with discomfort in the lateral aspect of the left breast. She was referred for mammography to the breast Center 10/30/2011, and this showed dense breast tissue bilaterally but no suspicious finding in the left breast. There was no palpable abnormality. Ultrasound of the left breast was negative   On repeat diagnostic mammography a year later however, 11/01/2012, a spiculated mass was found in the upper right breast. There were a few associated punctate calcifications. An area of focal thickening was palpable and ultrasound confirmed a 1.2 cm irregular hypoechoic mass at the 12:00 position of the right breast 3 cm from the nipple. There was no abnormal adenopathy. The rest of the right breast and the left breast were unremarkable.  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 The MIB-1-1 was 16%. There was no HER-2 amplification.  The patient's subsequent history is as detailed below  INTERVAL HISTORY: Drake returns today for follow-up of her estrogen receptor positive breast cancer. The interval history is generally unremarkable. She is tolerating anastrozole without any major side effects and in particular she does not have the arthralgias and myalgias that many patients can develop. She obtains a drug at a good price.  REVIEW OF SYSTEMS: Bryauna developed the classic cough with lisinopril and was recently switched to losartan. She has also been prescribed rosuvastatin. She has not started it yet so We don't know if she is going to  tolerated well or not. She says her sugars are "okay". She's having some discomfort in the lateral aspect of the left breast that she wanted me to palpate today. This comes and goes. Aside from that a detailed review of systems today was noncontributory.  PAST MEDICAL HISTORY: Past Medical History  Diagnosis Date  . Breast cancer ([place])     right side, er 86% +, PR 100%  . Diabetes mellitus without complication ([place])   . Hypertension     sees Dr. Verdis Prime [phone]  . Sleep apnea     "has not used cpap in a long time", sleep study over 5 years ago  . Anxiety   . Complex endometrial hyperplasia 2017    Untreated by patient choice.   hypertension  PAST SURGICAL HISTORY: Past Surgical History  Procedure Laterality Date  . Colonoscopy    . Breast lumpectomy with needle localization and axillary sentinel lymph node bx Right 12/10/2012    Procedure: BREAST LUMPECTOMY WITH NEEDLE LOCALIZATION AND AXILLARY SENTINEL LYMPH NODE BX;  Surgeon: Harl Bowie, MD;  Location: [facility];  Service: General;  Laterality: Right;  . Breast surgery  11/2012    Rt. lumpectomy--  . Dilatation & curettage/hysteroscopy with myosure N/A 11/13/2015    Procedure: DILATATION & CURETTAGE/HYSTEROSCOPY WITH MYOSURE;  Surgeon: Nunzio Cobbs, MD;  Location: [facility];  Service: Gynecology;  Laterality: N/A;    FAMILY HISTORY Family History  Problem Relation Age of Onset  . Heart attack Father 66    deceased  . Kidney failure Mother     dec 75  . Diabetes Mother   . Cancer Brother 49  Dec lung ca  . Cancer Brother 38    Dec lung ca  . Thyroid disease Sister    The patient's father died from a myocardial infarction at age 40. The patient's mother died from renal failure at age 67. The patient had 2 half brothers. Both died from lung cancer in the setting of tobacco abuse. The patient has 4 sisters. There is no history of breast or ovarian cancer in the family.  GYNECOLOGIC  HISTORY: Menarche age 34, first live birth age 14, she is [place] P1. Menopause 2009. She never took hormone replacement.  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 Her husband Clair Gulling is a Administrator, and she makes calls to arrange for his loads. She also accompanies him on some of his long rides, which means she is away from home are quite a bit. Their son Corene Cornea lives in [place] where he works as a Dealer. The patient has 1 grandchild, currently 56 years old. She is not a Ambulance person.  ADVANCED DIRECTIVES: Not in place  HEALTH MAINTENANCE: Social History  Substance Use Topics  . Smoking status: Never Smoker   . Smokeless tobacco: Never Used  . Alcohol Use: No     Colonoscopy: 2013  PAP: 2012  Bone density: 208/13/2014 / normal ([facility])  Lipid panel:  No Known Allergies  Current Outpatient Prescriptions  Medication Sig Dispense Refill  . losartan (COZAAR) 100 MG tablet Take 100 mg by mouth daily.    . rosuvastatin (CRESTOR) 5 MG tablet Take 5 mg by mouth daily.    Marland Kitchen anastrozole (ARIMIDEX) 1 MG tablet Take 1 tablet (1 mg total) by mouth daily. 90 tablet 12  . BD PEN NEEDLE NANO U/F 32G X 4 MM MISC   1  . fish oil-omega-3 fatty acids 1000 MG capsule Take 1 g by mouth 2 (two) times daily.     Marland Kitchen FREESTYLE LITE test strip 3 (three) times daily. for testing  1  . glipiZIDE (GLUCOTROL) 10 MG tablet Take 20 mg by mouth 2 (two) times daily before a meal.    . ibuprofen (ADVIL,MOTRIN) 800 MG tablet Take 1 tablet (800 mg total) by mouth every 8 (eight) hours as needed. 30 tablet 0  . Lancets (FREESTYLE) lancets     . LANTUS SOLOSTAR 100 UNIT/ML Solostar Pen Inject 50 Units into the skin 2 (two) times daily.     Marland Kitchen LORazepam (ATIVAN) 0.5 MG tablet Take 0.5 mg by mouth as needed for anxiety.     . metFORMIN (GLUCOPHAGE) 500 MG tablet Take 500 mg by mouth 2 (two) times daily with a meal.     . Multiple Vitamin  (MULTIVITAMIN WITH MINERALS) TABS Take 1 tablet by mouth daily.    . vitamin E 1000 UNIT capsule Take 1,000 Units by mouth daily.     No current facility-administered medications for this visit.      Objective: Middle-aged white womanWho appears stated age 60 Vitals:   03/17/16 1109  BP: 143/66  Pulse: 90  Temp: 99 F (37.2 C)  Resp: 18     Body mass index is 40.72 kg/(m^2).    ECOG FS: 1 Filed Weights   03/17/16 1109  Weight: 244 lb 11.2 oz (110.995 kg)   Sclerae unicteric, pupils round and equal Oropharynx clear and moist-- no thrush or other lesions No cervical or supraclavicular adenopathy Lungs no rales or rhonchi Heart regular rate and  rhythm Abd soft, obese, nontender, positive bowel sounds MSK no focal spinal tenderness, no upper extremity lymphedema Neuro: nonfocal, well oriented, appropriate affect Breasts: The right breast is status post radiation and surgery. There is no evidence of local recurrence or the right axilla is benign. The left breast, where she feels some discomfort, is entirely unremarkable. There is no mass or skin change of concern. The left axilla is benign.   LAB RESULTS: Lab Results  Component Value Date   WBC 5.0 03/17/2016   NEUTROABS 2.7 03/17/2016   HGB 14.9 03/17/2016   HCT 42.9 03/17/2016   MCV 93.3 03/17/2016   PLT 162 03/17/2016      Chemistry      Component Value Date/Time   NA 138 03/17/2016 1052   NA 134* 12/03/2012 1404   K 4.7 03/17/2016 1052   K 4.1 12/03/2012 1404   CL 93* 12/03/2012 1404   CL 96* 11/17/2012 1223   CO2 27 03/17/2016 1052   CO2 29 12/03/2012 1404   BUN 9.2 03/17/2016 1052   BUN 12 12/03/2012 1404   CREATININE 0.8 03/17/2016 1052   CREATININE 0.65 12/03/2012 1404      Component Value Date/Time   CALCIUM 10.1 03/17/2016 1052   CALCIUM 10.6* 12/18/2014 1456   ALKPHOS 53 03/17/2016 1052   AST 38* 03/17/2016 1052   ALT 43 03/17/2016 1052   BILITOT 0.65 03/17/2016 1052        STUDIES: Repeat mammogram due today  ASSESSMENT: 65 y.o. [place], [place] woman   (1)  status post right breast lumpectomy and sentinel lymph node sampling 12/10/2012 for a pT1b pN0, stage IA invasive ductal carcinoma, grade 1, estrogen receptor 86% and progesterone receptor 100% positive, with an MIB-1 of 16% and no HER-2 amplification   (2) Oncotype DX score of 17 predicts a distant recurrence rate of 11% within 10 years if the patient's only systemic treatment is tamoxifen for 5 years.  (3)  status post radiation therapy, completed 03/11/2013  (4)  started on anastrozole 03/13/2013;   (a) bone density 05/25/2013 was normal  (b) repeat bone density 08/09/2015 again normal  (5) intermittent hypercalcemia; PTH normal, likely cause his hydrochlorothiazide   PLAN:  Daliyah is now a little over 3 years out from definitive surgery for her breast cancer with no evidence of disease recurrence. This is very favorable.  He has also completed 3 of the 5 planned years on anastrozole. She is tolerating that well, with no significant hot flashes or vaginal dryness problems and with good bone density results  She will have her mammogram today. I suggested she see me again in one year, but this made her very uncomfortable and she would like to be seen in 6 months. Accordingly I am scheduling her with my survivorship nurse practitioner, to initiate the survivorship program, and then she will see me 6 months after that, a year from now, her routine visit.  I reassured Aanyah that the small irregularity in her liver function tests are going to be due to fat in the liver. The treatment for that is the same as a treatment for her diabetes, namely diet and exercise.  Vaughan Basta any problems that may develop before her return visit here.   MAGRINAT,GUSTAV C    03/17/2016

## 2016-03-17 ENCOUNTER — Ambulatory Visit
Admission: RE | Admit: 2016-03-17 | Discharge: 2016-03-17 | Disposition: A | Discharge status: Home / Self Care | Payer: Medicare Other | Source: Ambulatory Visit | Attending: Oncology | Admitting: Oncology

## 2016-03-17 ENCOUNTER — Ambulatory Visit (HOSPITAL_BASED_OUTPATIENT_CLINIC_OR_DEPARTMENT_OTHER): Payer: Medicare Other | Admitting: Oncology

## 2016-03-17 ENCOUNTER — Other Ambulatory Visit (HOSPITAL_BASED_OUTPATIENT_CLINIC_OR_DEPARTMENT_OTHER): Payer: Medicare Other

## 2016-03-17 ENCOUNTER — Telehealth: Payer: Self-pay | Admitting: Oncology

## 2016-03-17 VITALS — BP 143/66 | HR 90 | Temp 99.0°F | Resp 18 | Ht 65.0 in | Wt 244.7 lb

## 2016-03-17 DIAGNOSIS — C50411 Malignant neoplasm of upper-outer quadrant of right female breast: Secondary | ICD-10-CM | POA: Diagnosis present

## 2016-03-17 DIAGNOSIS — K760 Fatty (change of) liver, not elsewhere classified: Secondary | ICD-10-CM | POA: Diagnosis not present

## 2016-03-17 LAB — COMPREHENSIVE METABOLIC PANEL
ALT: 43 U/L (ref 0–55)
AST: 38 U/L — AB (ref 5–34)
Albumin: 3.8 g/dL (ref 3.5–5.0)
Alkaline Phosphatase: 53 U/L (ref 40–150)
Anion Gap: 10 mEq/L (ref 3–11)
BUN: 9.2 mg/dL (ref 7.0–26.0)
CO2: 27 meq/L (ref 22–29)
Calcium: 10.1 mg/dL (ref 8.4–10.4)
Chloride: 100 mEq/L (ref 98–109)
Creatinine: 0.8 mg/dL (ref 0.6–1.1)
EGFR: 73 mL/min/{1.73_m2} — AB (ref >90)
GLUCOSE: 190 mg/dL — AB (ref 70–140)
POTASSIUM: 4.7 meq/L (ref 3.5–5.1)
SODIUM: 138 meq/L (ref 136–145)
TOTAL PROTEIN: 7.4 g/dL (ref 6.4–8.3)
Total Bilirubin: 0.65 mg/dL (ref 0.20–1.20)

## 2016-03-17 LAB — CBC WITH DIFFERENTIAL/PLATELET
BASO%: 0.2 % (ref 0.0–2.0)
Basophils Absolute: 0 10*3/uL (ref 0.0–0.1)
EOS ABS: 0.1 10*3/uL (ref 0.0–0.5)
EOS%: 2.8 % (ref 0.0–7.0)
HCT: 42.9 % (ref 34.8–46.6)
HEMOGLOBIN: 14.9 g/dL (ref 11.6–15.9)
LYMPH%: 33.9 % (ref 14.0–49.7)
MCH: 32.4 pg (ref 25.1–34.0)
MCHC: 34.7 g/dL (ref 31.5–36.0)
MCV: 93.3 fL (ref 79.5–101.0)
MONO#: 0.5 10*3/uL (ref 0.1–0.9)
MONO%: 10.5 % (ref 0.0–14.0)
NEUT%: 52.6 % (ref 38.4–76.8)
NEUTROS ABS: 2.7 10*3/uL (ref 1.5–6.5)
Platelets: 162 10*3/uL (ref 145–400)
RBC: 4.6 10*6/uL (ref 3.70–5.45)
RDW: 13 % (ref 11.2–14.5)
WBC: 5 10*3/uL (ref 3.9–10.3)
lymph#: 1.7 10*3/uL (ref 0.9–3.3)

## 2016-03-17 MED ORDER — ANASTROZOLE 1 MG PO TABS: 1.0000 mg | ORAL_TABLET | Freq: Every day | ORAL | Status: DC

## 2016-03-17 NOTE — Telephone Encounter (Signed)
Appt made and avs printed °

## 2016-05-16 ENCOUNTER — Ambulatory Visit: Payer: BLUE CROSS/BLUE SHIELD | Admitting: Obstetrics and Gynecology

## 2016-05-23 ENCOUNTER — Ambulatory Visit: Payer: BLUE CROSS/BLUE SHIELD | Admitting: Obstetrics and Gynecology

## 2016-09-09 ENCOUNTER — Telehealth: Payer: Self-pay | Admitting: *Deleted

## 2016-09-09 ENCOUNTER — Other Ambulatory Visit: Payer: Self-pay | Admitting: *Deleted

## 2016-09-09 DIAGNOSIS — C50411 Malignant neoplasm of upper-outer quadrant of right female breast: Secondary | ICD-10-CM

## 2016-09-09 DIAGNOSIS — E119 Type 2 diabetes mellitus without complications: Secondary | ICD-10-CM

## 2016-09-09 NOTE — Telephone Encounter (Signed)
Pt called in question about her appt and when she was informed of the date, she stated that she cannot come.  I rescheduled and confirmed pt to see Gretchen on 09/15/16.

## 2016-09-15 ENCOUNTER — Other Ambulatory Visit (HOSPITAL_BASED_OUTPATIENT_CLINIC_OR_DEPARTMENT_OTHER): Payer: Medicare Other

## 2016-09-15 ENCOUNTER — Ambulatory Visit (HOSPITAL_BASED_OUTPATIENT_CLINIC_OR_DEPARTMENT_OTHER): Payer: Medicare Other | Admitting: Adult Health

## 2016-09-15 VITALS — BP 178/78 | HR 91 | Temp 98.0°F | Resp 17 | Ht 65.0 in | Wt 252.9 lb

## 2016-09-15 DIAGNOSIS — E119 Type 2 diabetes mellitus without complications: Secondary | ICD-10-CM

## 2016-09-15 DIAGNOSIS — C50411 Malignant neoplasm of upper-outer quadrant of right female breast: Secondary | ICD-10-CM

## 2016-09-15 DIAGNOSIS — Z170 Estrogen receptor positive status [ER+]: Secondary | ICD-10-CM

## 2016-09-15 LAB — COMPREHENSIVE METABOLIC PANEL
ALBUMIN: 3.7 g/dL (ref 3.5–5.0)
ALK PHOS: 56 U/L (ref 40–150)
ALT: 42 U/L (ref 0–55)
ANION GAP: 11 meq/L (ref 3–11)
AST: 38 U/L — ABNORMAL HIGH (ref 5–34)
BUN: 12.5 mg/dL (ref 7.0–26.0)
CO2: 26 meq/L (ref 22–29)
Calcium: 10.8 mg/dL — ABNORMAL HIGH (ref 8.4–10.4)
Chloride: 101 mEq/L (ref 98–109)
Creatinine: 0.8 mg/dL (ref 0.6–1.1)
EGFR: 77 mL/min/{1.73_m2} — AB (ref >90)
GLUCOSE: 184 mg/dL — AB (ref 70–140)
POTASSIUM: 4.7 meq/L (ref 3.5–5.1)
SODIUM: 138 meq/L (ref 136–145)
Total Bilirubin: 0.53 mg/dL (ref 0.20–1.20)
Total Protein: 7.3 g/dL (ref 6.4–8.3)

## 2016-09-15 LAB — CBC WITH DIFFERENTIAL/PLATELET
BASO%: 0.5 % (ref 0.0–2.0)
BASOS ABS: 0 10*3/uL (ref 0.0–0.1)
EOS%: 2 % (ref 0.0–7.0)
Eosinophils Absolute: 0.1 10*3/uL (ref 0.0–0.5)
HEMATOCRIT: 40.4 % (ref 34.8–46.6)
HGB: 13.7 g/dL (ref 11.6–15.9)
LYMPH#: 1.4 10*3/uL (ref 0.9–3.3)
LYMPH%: 24 % (ref 14.0–49.7)
MCH: 31.7 pg (ref 25.1–34.0)
MCHC: 33.9 g/dL (ref 31.5–36.0)
MCV: 93.4 fL (ref 79.5–101.0)
MONO#: 0.5 10*3/uL (ref 0.1–0.9)
MONO%: 7.9 % (ref 0.0–14.0)
NEUT#: 3.8 10*3/uL (ref 1.5–6.5)
NEUT%: 65.6 % (ref 38.4–76.8)
Platelets: 167 10*3/uL (ref 145–400)
RBC: 4.33 10*6/uL (ref 3.70–5.45)
RDW: 14.1 % (ref 11.2–14.5)
WBC: 5.8 10*3/uL (ref 3.9–10.3)

## 2016-09-15 LAB — DRAW EXTRA CLOT TUBE

## 2016-09-15 NOTE — Progress Notes (Signed)
CLINIC:  Survivorship   REASON FOR VISIT:  Routine follow-up for history of breast cancer.   BRIEF ONCOLOGIC HISTORY:  (From Dr. Virgie Dad last visit on 03/17/16)     INTERVAL HISTORY:  Ms. Mcdonnell presents to the [facility] today for routine follow-up for her history of breast cancer.  Overall, she reports feeling quite well.   Her only complaint today is not sleeping well. This has been a chronic issue for her.  She has diabetes. She missed her recent follow-up appointment with her PCP because she was out of town with her husband.  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  She has been trying to do better with her diet. She tells me "I don't exercise like I should."  She called Dr. Virgie Dad nurse a few days ago and asked that we check her cholesterol and hemoglobin A1c labs today since she missed her PCP appointment.  She is supposed to see them again in 11/2016.  She expresses anxiety about the possibility of breast cancer recurrence. She does not have any breast specific complaints today. She remains on the anastrozole and is tolerating it very well.  Denies arthralgias, hot flashes, or vaginal dryness.      REVIEW OF SYSTEMS:  Review of Systems  Constitutional: Negative.   HENT:  Negative.   Eyes: Negative.   Respiratory: Negative.   Cardiovascular: Negative.   Gastrointestinal: Negative.   Endocrine: Negative.   Genitourinary: Negative.    Musculoskeletal: Negative.   Neurological: Negative.   Hematological: Negative.   Psychiatric/Behavioral: The patient is nervous/anxious.   Breast: Denies any new nodularity, masses, tenderness, nipple changes, or nipple discharge.    A 14-point review of systems was completed and was negative, except as noted above.    PAST MEDICAL/SURGICAL HISTORY:  Past Medical History:  Diagnosis Date  . Anxiety   . Breast cancer ([place])    right side, er 86% +, PR 100%  . Complex endometrial hyperplasia 2017   Untreated by patient choice.  . Diabetes mellitus without complication ([place])   . Hypertension    sees Dr. Verdis Prime [phone]  . Sleep apnea    "has not used cpap in a long time", sleep study over 5 years ago   Past Surgical History:  Procedure Laterality Date  . BREAST LUMPECTOMY WITH NEEDLE LOCALIZATION AND AXILLARY SENTINEL LYMPH NODE BX Right 12/10/2012   Procedure: BREAST LUMPECTOMY WITH NEEDLE LOCALIZATION AND AXILLARY SENTINEL LYMPH NODE BX;  Surgeon: Harl Bowie, MD;  Location: [facility];  Service: General;  Laterality: Right;  . BREAST SURGERY  11/2012   Rt. lumpectomy--  . COLONOSCOPY    . DILATATION & CURETTAGE/HYSTEROSCOPY WITH MYOSURE N/A 11/13/2015   Procedure: DILATATION & CURETTAGE/HYSTEROSCOPY WITH MYOSURE;  Surgeon: Nunzio Cobbs, MD;  Location: [facility];  Service: Gynecology;  Laterality: N/A;     ALLERGIES:  No Known Allergies   CURRENT MEDICATIONS:  Outpatient Encounter Prescriptions as of 09/15/2016  Medication Sig Note  . anastrozole (ARIMIDEX) 1 MG tablet Take 1 tablet (1 mg total) by mouth daily.   . BD PEN NEEDLE NANO U/F 32G X 4 MM MISC  05/03/2015: Received from: External Pharmacy  . fish oil-omega-3 fatty acids 1000 MG capsule Take 1 g by mouth 2 (two) times daily.    Marland Kitchen FREESTYLE LITE test strip 3 (three) times daily. for testing 05/03/2015: Received from: External Pharmacy  . glipiZIDE (GLUCOTROL) 10 MG tablet Take 20 mg by  mouth 2 (two) times daily before a meal.   . LANTUS SOLOSTAR 100 UNIT/ML Solostar Pen Inject 50 Units into the skin 2 (two) times daily.  11/13/2015: Took 25 units ( half a dose) on 1/30 . Blood sugar 120 this AM 1/31.  Kristine Royal, RN  . LORazepam (ATIVAN) 0.5 MG tablet Take 0.5 mg by mouth as needed for anxiety.    Marland Kitchen losartan (COZAAR) 100 MG tablet Take 100 mg by mouth daily.   . metFORMIN (GLUCOPHAGE) 500 MG tablet Take 500 mg by mouth 2 (two) times daily with a meal.    . Multiple Vitamin (MULTIVITAMIN WITH MINERALS)  TABS Take 1 tablet by mouth daily.   . rosuvastatin (CRESTOR) 5 MG tablet Take 5 mg by mouth daily.   . vitamin E 1000 UNIT capsule Take 1,000 Units by mouth daily.   . [DISCONTINUED] ibuprofen (ADVIL,MOTRIN) 800 MG tablet Take 1 tablet (800 mg total) by mouth every 8 (eight) hours as needed.   . [DISCONTINUED] Lancets (FREESTYLE) lancets  03/07/2014: Received from: External Pharmacy   No facility-administered encounter medications on file as of 09/15/2016.      ONCOLOGIC FAMILY HISTORY:  Family History  Problem Relation Age of Onset  . Heart attack Father 19    deceased  . Kidney failure Mother     dec 75  . Diabetes Mother   . Cancer Brother 58    Dec lung ca  . Cancer Brother 34    Dec lung ca  . Thyroid disease Sister     GENETIC COUNSELING/TESTING: No records available for review.    SOCIAL HISTORY:  SKYLAN GIFT is married and lives with her husband in [place], [place].  Her husband works as a Administrator and she often rides along with him. They have 1 son and 1 grandson.  She denies any current tobacco, alcohol, or illicit drug use.    PHYSICAL EXAMINATION:  Vital Signs: Vitals:   09/15/16 1306  BP: (!) 178/78  Pulse: 91  Resp: 17  Temp: 98 F (36.7 C)   Filed Weights   09/15/16 1306  Weight: 252 lb 14.4 oz (114.7 kg)   General: Well-nourished, well-appearing female in no acute distress.  Unaccompanied today.   HEENT: Head is normocephalic.  Pupils equal and reactive to light. Conjunctivae clear without exudate.  Sclerae anicteric. Oral mucosa is pink, moist.  Oropharynx is pink without lesions or erythema.  Lymph: No cervical, supraclavicular, or infraclavicular lymphadenopathy noted on palpation.  Cardiovascular: Regular rate and rhythm.Marland Kitchen Respiratory: Clear to auscultation bilaterally. Chest expansion symmetric; breathing non-labored.  Breast Exam:  -Left breast: No appreciable masses on palpation. No skin redness, thickening, or peau d'orange appearance.    -Right breast: No appreciable masses on palpation. No skin redness, thickening, or peau d'orange appearance; no nipple retraction or nipple discharge; healed scar without erythema or nodularity. -Axilla: No axillary adenopathy bilaterally.  GI: Abdomen soft and round; non-tender. Bowel sounds normoactive. No hepatosplenomegaly.   GU: Deferred.  Neuro: No focal deficits. Steady gait.  Psych: Mood and affect normal and appropriate for situation.  Extremities: No edema. Skin: Warm and dry.  LABORATORY DATA:  CBC Latest Ref Rng & Units 09/15/2016 03/17/2016 10/22/2015  WBC 3.9 - 10.3 10e3/uL 5.8 5.0 6.8  Hemoglobin 11.6 - 15.9 g/dL 13.7 14.9 15.6  Hematocrit 34.8 - 46.6 % 40.4 42.9 46.4  Platelets 145 - 400 10e3/uL 167 162 190   CMP Latest Ref Rng & Units 09/15/2016 03/17/2016 10/22/2015  Glucose 70 - 140 mg/dl 184(H) 190(H) 204(H)  BUN 7.0 - 26.0 mg/dL 12.5 9.2 15.9  Creatinine 0.6 - 1.1 mg/dL 0.8 0.8 1.0  Sodium 136 - 145 mEq/L 138 138 134(L)  Potassium 3.5 - 5.1 mEq/L 4.7 4.7 4.6  Chloride 96 - 112 mEq/L - - -  CO2 22 - 29 mEq/L '26 27 25  ' Calcium 8.4 - 10.4 mg/dL 10.8(H) 10.1 11.1(H)  Total Protein 6.4 - 8.3 g/dL 7.3 7.4 7.2  Total Bilirubin 0.20 - 1.20 mg/dL 0.53 0.65 0.82  Alkaline Phos 40 - 150 U/L 56 53 59  AST 5 - 34 U/L 38(H) 38(H) 45(H)  ALT 0 - 55 U/L 42 43 46   Lipids and Hemoglobin A1c pending (per patient request and as ordered by Dr. Jana Hakim)  *Available labs reviewed with patient in detail and are largely stable.    DIAGNOSTIC IMAGING:  Most recent mammogram: 03/17/16     ASSESSMENT AND PLAN:  Ms.. Etcheverry is a pleasant 65 y.o. female with history of Stage IA right breast invasive ductal carcinoma, ER+/PR+/HER2-, diagnosed in 11/2012; treated with lumpectomy, adjuvant radiation therapy, and anti-estrogen therapy with Anastrozole beginning in 03/2013.  She presents to the [facility] for surveillance and routine follow-up.   1. History of Stage IA right breast  cancer:  Ms. Ligman is currently clinically and radiographically without evidence of disease or recurrence of breast cancer. She will be due for mammogram in 03/2017; orders placed today.  She requests having her mammograms on the same day as when she sees Dr. Jana Hakim because she has to travel from [facility] for these visits.  She will continue her anti-estrogen therapy with anastrozole, with plans to continue for 5-10 years.  She will return to the cancer center to see her medical oncologist, Dr. Jana Hakim, in 03/2017.  I encouraged her to call me with any questions or concerns before her next visit at the cancer center, and I would be happy to see her sooner, if needed.    2. Diabetes and cholesterol management: Labs pending. She tells me she was fasting for these labs although they were collected later in the afternoon. She tells me her blood sugars have generally been "okay."  Her serum glucose on today's labs were 184, which is high for a fasting blood sugar.  She tells me she is taking her medications and insulin as prescribed by her PCP.  She is also on Crestor as prescribed as well.  I stressed the importance of diet and exercise in both diabetes and hyperlipidemia management.  Given that she is "on the road" a lot with her husband, I recommended that she try to make an effort to go for a 15-20 minute walk at every truck stop. I also recommended that she try to make more healthy food options when eating out and reducing portions.  It is important that her overall health is optimized as part of her life after cancer. She will keep working on these efforts. We will call her with the pending labs when I receive the results.   3. Bone health:  Given Ms. Cabello's age, history of breast cancer, and her current anti-estrogen therapy with anti-estrogen therapy, she is at risk for bone demineralization. Her last DEXA scan was on 08/09/15 and was normal. She will be due for biennial DEXA scan in 07/2017.  In the  meantime, she was encouraged to increase her consumption of foods rich in calcium, as well as increase her weight-bearing activities.  She was given education on specific food and activities to promote bone health.  4. Health maintenance and wellness promotion: Ms. Gillham was encouraged to consume 5-7 servings of fruits and vegetables per day. She was also encouraged to engage in moderate to vigorous exercise for 30 minutes per day most days of the week. She was instructed to limit her alcohol consumption and continue to abstain from tobacco use.    Dispo:  -Return to cancer center to see Dr. Jana Hakim in 03/2017. -Mammogram due in 03/2017; orders placed today; pt needs mammogram on same day as follow-up visit with Dr. Jana Hakim.     A total of 30 minutes of face-to-face time was spent with this patient with greater than 50% of that time in counseling and care-coordination.   Mike Craze, NP Survivorship [facility] [place] [phone]   Note: PRIMARY CARE PROVIDER Sharyl Nimrod, [place] [phone]

## 2016-09-16 LAB — LIPID PANEL
CHOL/HDL RATIO: 2.4 ratio (ref 0.0–4.4)
CHOLESTEROL TOTAL: 160 mg/dL (ref 100–199)
HDL: 67 mg/dL (ref >39)
LDL Calculated: 74 mg/dL (ref 0–99)
TRIGLYCERIDES: 93 mg/dL (ref 0–149)
VLDL Cholesterol Cal: 19 mg/dL (ref 5–40)

## 2016-09-16 LAB — HEMOGLOBIN A1C
Est. average glucose Bld gHb Est-mCnc: 166 mg/dL
HEMOGLOBIN A1C: 7.4 % — AB (ref 4.8–5.6)

## 2016-09-17 ENCOUNTER — Telehealth: Payer: Self-pay | Admitting: *Deleted

## 2016-09-17 NOTE — Telephone Encounter (Signed)
Called pt to review lab results with her. No answer, could not leave message, phone has not been set up to receive messages. There is only one phone # available in our records for this pt. Message to be fwd to G. Renato Battles, NP.

## 2016-09-18 ENCOUNTER — Telehealth: Payer: Self-pay | Admitting: Emergency Medicine

## 2016-09-18 ENCOUNTER — Encounter: Payer: Self-pay | Admitting: Adult Health

## 2016-09-18 NOTE — Telephone Encounter (Signed)
Spoke with patient; reported her recent A1C and cholesterol results to her. Per patient's request; a copy of her labs is being mailed to her. Patient verbalized that she will report these to her PCP. Instructed patient to call for any further concerns.

## 2016-09-19 ENCOUNTER — Other Ambulatory Visit: Payer: Medicare Other

## 2016-09-19 ENCOUNTER — Encounter: Payer: Medicare Other | Admitting: Adult Health

## 2017-03-17 ENCOUNTER — Ambulatory Visit: Payer: Medicare Other | Admitting: Oncology

## 2017-03-17 ENCOUNTER — Other Ambulatory Visit: Payer: Medicare Other

## 2017-03-18 ENCOUNTER — Ambulatory Visit
Admission: RE | Admit: 2017-03-18 | Discharge: 2017-03-18 | Disposition: A | Discharge status: Home / Self Care | Payer: Medicare Other | Source: Ambulatory Visit | Attending: Adult Health | Admitting: Adult Health

## 2017-03-18 ENCOUNTER — Telehealth: Payer: Self-pay | Admitting: Oncology

## 2017-03-18 ENCOUNTER — Other Ambulatory Visit: Payer: Medicare Other

## 2017-03-18 ENCOUNTER — Ambulatory Visit: Payer: Medicare Other | Admitting: Oncology

## 2017-03-18 DIAGNOSIS — C50411 Malignant neoplasm of upper-outer quadrant of right female breast: Secondary | ICD-10-CM

## 2017-03-18 DIAGNOSIS — Z170 Estrogen receptor positive status [ER+]: Principal | ICD-10-CM

## 2017-03-18 NOTE — Telephone Encounter (Signed)
sw pt to confirm r/s 6/6 appt to 7/12 at 1145 due to GM out of office

## 2017-04-08 ENCOUNTER — Other Ambulatory Visit: Payer: Medicare Other

## 2017-04-08 ENCOUNTER — Ambulatory Visit: Payer: Medicare Other | Admitting: Oncology

## 2017-04-23 ENCOUNTER — Ambulatory Visit: Payer: Medicare Other | Admitting: Oncology

## 2017-04-23 ENCOUNTER — Other Ambulatory Visit: Payer: Medicare Other

## 2017-05-11 ENCOUNTER — Other Ambulatory Visit: Payer: Self-pay | Admitting: Oncology

## 2017-06-11 ENCOUNTER — Other Ambulatory Visit: Payer: Medicare Other

## 2017-06-11 ENCOUNTER — Ambulatory Visit: Payer: Medicare Other | Admitting: Oncology

## 2017-08-17 NOTE — Progress Notes (Signed)
ID: Linda Boyer   DOB: [birth year]  MR#: [id number]  CSN#:[id number]   Patient Care Team: Sharyl Nimrod, MD as PCP - General (Internal Medicine) Ranell Patrick (Inactive) as Referring Physician Coralie Keens, MD as Consulting Physician (General Surgery)  CHIEF COMPLAINT: Early stage estrogen receptor positive breast cancer  CURRENT TREATMENT: Anastrozole   BREAST CANCERHISTORY: From the original intake note:  Ms. Fagin originally presented with discomfort in the lateral aspect of the left breast. She was referred for mammography to the breast Center 10/30/2011, and this showed dense breast tissue bilaterally but no suspicious finding in the left breast. There was no palpable abnormality. Ultrasound of the left breast was negative   On repeat diagnostic mammography a year later however, 11/01/2012, a spiculated mass was found in the upper right breast. There were a few associated punctate calcifications. An area of focal thickening was palpable and ultrasound confirmed a 1.2 cm irregular hypoechoic mass at the 12:00 position of the right breast 3 cm from the nipple. There was no abnormal adenopathy. The rest of the right breast and the left breast were unremarkable.  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 The MIB-1-1 was 16%. There was no HER-2 amplification.  The patient's subsequent history is as detailed below  INTERVAL HISTORY: Jailyn returns today for follow-up and treatment of her estrogen receptor positive breast cancer.  She continues on anastrozole, with good tolerance. She has hot flashes that come and go, but they do not bother her. She denies vaginal dryness, but reports that she has an increase vaginal wetness.   Since her last visit, Brittanny has undergone a diagnostic bilateral mammogram with tomography and CAD on 03/18/2017 at [facility] with results showing no mammographic evidence of malignancy.    REVIEW OF SYSTEMS: Waunita reports that she is doing well overall. She takes her medications on an empty stomach and often has an upset stomach that will typically last approximately 1 hour following. She will try taking her medications after eating a meal to determine a difference.  She was recently started on a low dose prescription of thyroxine for thyroid issues. For exercise, she walks up and down her basement stairs about 10 times a day. She has been traveling with her husband who recently got a motorcycle, and they went to [place]. She will be celebrating her 50th wedding anniversary on June 7th 2019, which she is excited for. She denies unusual headaches, visual changes, nausea, vomiting, or dizziness. There has been no unusual cough, phlegm production, or pleurisy. This been no change in bowel or bladder habits.  She has developed right-sided retinal problems and is expecting surgery to the right retina before the end of the year.  She denies unexplained fatigue or unexplained weight loss, bleeding, rash, or fever. A detailed review of systems was otherwise stable.     PAST MEDICAL HISTORY: Past Medical History:  Diagnosis Date  . Anxiety   . Breast cancer ([place])    right side, er 86% +, PR 100%  . Complex endometrial hyperplasia 2017   Untreated by patient choice.  . Diabetes mellitus without complication ([place])   . Hypertension    sees Dr. Verdis Prime [phone]  . Sleep apnea    "has not used cpap in a long time", sleep study over 5 years ago   hypertension  PAST SURGICAL HISTORY: Past Surgical History:  Procedure  Laterality Date  . BREAST LUMPECTOMY Right 2014  . BREAST SURGERY  11/2012   Rt. lumpectomy--  . COLONOSCOPY      FAMILY HISTORY Family History  Problem Relation Age of Onset  . Heart attack Father 5       deceased  . Kidney failure Mother        dec 75  . Diabetes Mother    . Cancer Brother 13       Dec lung ca  . Cancer Brother 6       Dec lung ca  . Thyroid disease Sister    The patient's father died from a myocardial infarction at age 82. The patient's mother died from renal failure at age 67. The patient had 2 half brothers. Both died from lung cancer in the setting of tobacco abuse. The patient has 4 sisters. There is no history of breast or ovarian cancer in the family.  GYNECOLOGIC HISTORY: Menarche age 42, first live birth age 30, she is [place] P1. Menopause 2009. She never took hormone replacement.  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 Her husband Clair Gulling is a Administrator, and she makes calls to arrange for his loads. She also accompanies him on some of his long rides, which means she is away from home are quite a bit. Their son Corene Cornea lives in [place] where he works as a Dealer. The patient has 1 grandchild, currently 16 years old. She is not a Ambulance person.  ADVANCED DIRECTIVES: Not in place  HEALTH MAINTENANCE: Social History   Tobacco Use  . Smoking status: Never Smoker  . Smokeless tobacco: Never Used  Substance Use Topics  . Alcohol use: No    Alcohol/week: 0.0 oz  . Drug use: No     Colonoscopy: 2013  PAP: 2012  Bone density: 208/13/2014 / normal ([facility])  Lipid panel:  No Known Allergies  Current Outpatient Medications  Medication Sig Dispense Refill  . anastrozole (ARIMIDEX) 1 MG tablet Take 1 tablet (1 mg total) daily by mouth. 90 tablet 12  . BD PEN NEEDLE NANO U/F 32G X 4 MM MISC   1  . fish oil-omega-3 fatty acids 1000 MG capsule Take 1 g by mouth 2 (two) times daily.     Marland Kitchen FREESTYLE LITE test strip 3 (three) times daily. for testing  1  . glipiZIDE (GLUCOTROL) 10 MG tablet Take 20 mg by mouth 2 (two) times daily before a meal.    . ketoconazole (NIZORAL) 2 % cream Apply 1 application daily topically. 15 g 0  . LANTUS SOLOSTAR 100 UNIT/ML  Solostar Pen Inject 50 Units into the skin 2 (two) times daily.     Marland Kitchen LORazepam (ATIVAN) 0.5 MG tablet Take 0.5 mg by mouth as needed for anxiety.     Marland Kitchen losartan (COZAAR) 100 MG tablet Take 100 mg by mouth daily.    . metFORMIN (GLUCOPHAGE) 500 MG tablet Take 500 mg by mouth 2 (two) times daily with a meal.     . Multiple Vitamin (MULTIVITAMIN WITH MINERALS) TABS Take 1 tablet by mouth daily.    . rosuvastatin (CRESTOR) 5 MG tablet Take 5 mg by mouth daily.    . vitamin E 1000 UNIT capsule Take 1,000 Units by mouth daily.     No current facility-administered medications for this visit.       Objective: Morbidly obese white woman in no acute distress  Vitals:   08/18/17 1013  BP: (!) 158/75  Pulse: (!) 105  Resp: 18  Temp: 98.8 F (37.1 C)  SpO2: 97%     Body mass index is 43.37 kg/m.    ECOG FS: 1 Filed Weights   08/18/17 1013  Weight: 260 lb 9.6 oz (118.2 kg)   Sclerae unicteric, EOMs intact Oropharynx clear and moist No cervical or supraclavicular adenopathy Lungs no rales or rhonchi Heart regular rate and rhythm Abd soft, nontender, positive bowel sounds MSK no focal spinal tenderness, no upper extremity lymphedema Neuro: nonfocal, well oriented, appropriate affect Breasts: The right breast has undergone lumpectomy and radiation with no evidence of local recurrence.  The left breast is unremarkable.  Both axillae are benign.    LAB RESULTS: Lab Results  Component Value Date   WBC 6.1 08/18/2017   NEUTROABS 4.1 08/18/2017   HGB 14.3 08/18/2017   HCT 41.9 08/18/2017   MCV 93.7 08/18/2017   PLT 182 08/18/2017      Chemistry      Component Value Date/Time   NA 139 08/18/2017 0951   K 4.7 08/18/2017 0951   CL 93 (L) 12/03/2012 1404   CL 96 (L) 11/17/2012 1223   CO2 27 08/18/2017 0951   BUN 10.5 08/18/2017 0951   CREATININE 0.8 08/18/2017 0951      Component Value Date/Time   CALCIUM 10.2 08/18/2017 0951   ALKPHOS 57 08/18/2017 0951   AST 48 (H)  08/18/2017 0951   ALT 36 08/18/2017 0951   BILITOT 0.69 08/18/2017 0951       STUDIES: Since her last visit, Ameena has undergone a diagnostic bilateral mammogram with tomography and CAD on 03/18/2017 at [facility] with breast density category C. Stable right breast lumpectomy site. No mammographic evidence of malignancy in the bilateral breasts.  ASSESSMENT: 66 y.o. [place], [place] woman   (1)  status post right breast lumpectomy and sentinel lymph node sampling 12/10/2012 for a pT1b pN0, stage IA invasive ductal carcinoma, grade 1, estrogen receptor 86% and progesterone receptor 100% positive, with an MIB-1 of 16% and no HER-2 amplification   (2) Oncotype DX score of 17 predicts a distant recurrence rate of 11% within 10 years if the patient's only systemic treatment is tamoxifen for 5 years.  (3)  status post radiation therapy, completed 03/11/2013  (4)  started on anastrozole 03/13/2013;   (a) bone density 05/25/2013 was normal  (b) repeat bone density 08/09/2015 again normal  (5) intermittent hypercalcemia; PTH normal, likely cause his hydrochlorothiazide   PLAN:  Delvina is nearly 5 years out from definitive surgery for her breast cancer with no evidence of disease recurrence.  This is very favorable.  She continues on anastrozole, with good tolerance.  She will continue that until June of next year, at which time she will be ready to "graduate".  I am concerned about her obesity and metabolic syndrome.  I strongly suggested she exercise 45 minutes 5 days a week.  I recommended that she not eat bread, past I, rice, or potatoes.  She will consider that.  Otherwise she will see me one last time June 2019.  That is also the months of her 49th anniversary so she will have a double celebration    Sammy Cassar, Virgie Dad, MD  08/18/17 10:55 AM Medical Oncology and Hematology [facility] [place], [place] [address] Tel. [phone]    Fax.  [phone]  This document serves as a record of  services personally performed by Lurline Del, MD. It was created on his behalf by Sheron Nightingale, a trained medical scribe. The creation of this record is based on the scribe's personal observations and the provider's statements to them.   I have reviewed the above documentation for accuracy and completeness, and I agree with the above.

## 2017-08-18 ENCOUNTER — Other Ambulatory Visit (HOSPITAL_BASED_OUTPATIENT_CLINIC_OR_DEPARTMENT_OTHER): Payer: Medicare Other

## 2017-08-18 ENCOUNTER — Telehealth: Payer: Self-pay | Admitting: Oncology

## 2017-08-18 ENCOUNTER — Ambulatory Visit (HOSPITAL_BASED_OUTPATIENT_CLINIC_OR_DEPARTMENT_OTHER): Payer: Medicare Other | Admitting: Oncology

## 2017-08-18 ENCOUNTER — Other Ambulatory Visit: Payer: Self-pay | Admitting: Oncology

## 2017-08-18 VITALS — BP 158/75 | HR 105 | Temp 98.8°F | Resp 18 | Ht 65.0 in | Wt 260.6 lb

## 2017-08-18 DIAGNOSIS — Z853 Personal history of malignant neoplasm of breast: Secondary | ICD-10-CM

## 2017-08-18 DIAGNOSIS — Z170 Estrogen receptor positive status [ER+]: Principal | ICD-10-CM

## 2017-08-18 DIAGNOSIS — C50411 Malignant neoplasm of upper-outer quadrant of right female breast: Secondary | ICD-10-CM

## 2017-08-18 LAB — COMPREHENSIVE METABOLIC PANEL
ALBUMIN: 3.9 g/dL (ref 3.5–5.0)
ALK PHOS: 57 U/L (ref 40–150)
ALT: 36 U/L (ref 0–55)
AST: 48 U/L — AB (ref 5–34)
Anion Gap: 11 mEq/L (ref 3–11)
BILIRUBIN TOTAL: 0.69 mg/dL (ref 0.20–1.20)
BUN: 10.5 mg/dL (ref 7.0–26.0)
CALCIUM: 10.2 mg/dL (ref 8.4–10.4)
CO2: 27 mEq/L (ref 22–29)
CREATININE: 0.8 mg/dL (ref 0.6–1.1)
Chloride: 101 mEq/L (ref 98–109)
EGFR: >60 mL/min/{1.73_m2} (ref >60)
Glucose: 170 mg/dl — ABNORMAL HIGH (ref 70–140)
Potassium: 4.7 mEq/L (ref 3.5–5.1)
Sodium: 139 mEq/L (ref 136–145)
TOTAL PROTEIN: 7.5 g/dL (ref 6.4–8.3)

## 2017-08-18 LAB — CBC WITH DIFFERENTIAL/PLATELET
BASO%: 0.7 % (ref 0.0–2.0)
Basophils Absolute: 0 10*3/uL (ref 0.0–0.1)
EOS ABS: 0.1 10*3/uL (ref 0.0–0.5)
EOS%: 2.2 % (ref 0.0–7.0)
HEMATOCRIT: 41.9 % (ref 34.8–46.6)
HGB: 14.3 g/dL (ref 11.6–15.9)
LYMPH%: 22.4 % (ref 14.0–49.7)
MCH: 32 pg (ref 25.1–34.0)
MCHC: 34.2 g/dL (ref 31.5–36.0)
MCV: 93.7 fL (ref 79.5–101.0)
MONO#: 0.5 10*3/uL (ref 0.1–0.9)
MONO%: 7.8 % (ref 0.0–14.0)
NEUT%: 66.9 % (ref 38.4–76.8)
NEUTROS ABS: 4.1 10*3/uL (ref 1.5–6.5)
PLATELETS: 182 10*3/uL (ref 145–400)
RBC: 4.47 10*6/uL (ref 3.70–5.45)
RDW: 13.1 % (ref 11.2–14.5)
WBC: 6.1 10*3/uL (ref 3.9–10.3)
lymph#: 1.4 10*3/uL (ref 0.9–3.3)

## 2017-08-18 MED ORDER — KETOCONAZOLE 2 % EX CREA: 1.0000 "application " | TOPICAL_CREAM | Freq: Every day | CUTANEOUS | 0 refills | Status: AC

## 2017-08-18 MED ORDER — ANASTROZOLE 1 MG PO TABS: 1.0000 mg | ORAL_TABLET | Freq: Every day | ORAL | 12 refills | Status: DC

## 2017-08-18 NOTE — Telephone Encounter (Signed)
Gave patient avs and calendar with appts per 11/6 los.

## 2018-03-22 ENCOUNTER — Other Ambulatory Visit: Payer: Self-pay

## 2018-03-22 DIAGNOSIS — C50411 Malignant neoplasm of upper-outer quadrant of right female breast: Secondary | ICD-10-CM

## 2018-03-22 DIAGNOSIS — Z170 Estrogen receptor positive status [ER+]: Principal | ICD-10-CM

## 2018-03-22 NOTE — Progress Notes (Signed)
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 Melena originally presented with discomfort in the lateral aspect of the left breast. She was referred for mammography to the breast Center 10/30/2011, and this showed dense breast tissue bilaterally but no suspicious finding in the left breast. There was no palpable abnormality. Ultrasound of the left breast was negative   On repeat diagnostic mammography a year later however, 11/01/2012, a spiculated mass was found in the upper right breast. There were a few associated punctate calcifications. An area of focal thickening was palpable and ultrasound confirmed a 1.2 cm irregular hypoechoic mass at the 12:00 position of the right breast 3 cm from the nipple. There was no abnormal adenopathy. The rest of the right breast and the left breast were unremarkable.  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 The MIB-1-1 was 16%. There was no HER-2 amplification.  The patient's subsequent history is as detailed below  INTERVAL HISTORY: Linda Boyer returns today for a follow-up and treatment of her estrogen receptor positive breast cancer.   The patient continues on anastozole, which she tolerates well. She had hot flashes prior to taking tamoxifen and reports if anything they are better since taking it. She denies vaginal dryness.  She underwent a diagnostic bilateral  breast mammogram with CAD and TOMO, today, 03/23/2018, breat density category C, showing stable right breast lumpectomy site and no further evidence of malignancy.   REVIEW OF SYSTEMS: Linda Boyer is doing well overall. She has been trying to stay busy. She continues to work. For exercise she walks. The patient denies unusual headaches, visual changes, nausea, vomiting, or dizziness. There has been no unusual cough, phlegm production, or pleurisy. This been no change in bowel or bladder habits. The patient denies unexplained fatigue or unexplained weight loss, bleeding, rash, or fever. A detailed review of systems was otherwise noncontributory.    PAST MEDICAL HISTORY: Past Medical History:  Diagnosis Date  . Anxiety   . Breast cancer ([place])    right side, er 86% +, PR 100%  . Complex endometrial hyperplasia 2017   Untreated by patient choice.  . Diabetes mellitus without complication ([place])   . Hypertension    sees Dr. Verdis Prime [phone]  . Personal history of radiation therapy   . Sleep apnea    "has not used cpap in a long time", sleep study over 5 years ago   hypertension  PAST SURGICAL HISTORY: Past Surgical History:  Procedure Laterality Date  . BREAST LUMPECTOMY Right 2014  . BREAST LUMPECTOMY WITH NEEDLE LOCALIZATION AND AXILLARY SENTINEL LYMPH NODE BX Right 12/10/2012   Procedure: BREAST LUMPECTOMY WITH NEEDLE LOCALIZATION AND AXILLARY SENTINEL LYMPH NODE BX;  Surgeon: Harl Bowie, MD;  Location: [facility];  Service: General;  Laterality: Right;  . BREAST SURGERY  11/2012   Rt. lumpectomy--  . COLONOSCOPY    . DILATATION & CURETTAGE/HYSTEROSCOPY WITH MYOSURE N/A 11/13/2015   Procedure: DILATATION & CURETTAGE/HYSTEROSCOPY WITH MYOSURE;  Surgeon: Everardo All  Yisroel Ramming, MD;  Location: [facility];  Service: Gynecology;  Laterality: N/A;    FAMILY HISTORY Family History  Problem Relation Age of Onset  . Heart attack Father 32       deceased  . Kidney failure Mother         dec 75  . Diabetes Mother   . Cancer Brother 89       Dec lung ca  . Cancer Brother 76       Dec lung ca  . Thyroid disease Sister   . Breast cancer Neg Hx    The patient's father died from a myocardial infarction at age 8. The patient's mother died from renal failure at age 48. The patient had 2 half brothers. Both died from lung cancer in the setting of tobacco abuse. The patient has 4 sisters. There is no history of breast or ovarian cancer in the family.  GYNECOLOGIC HISTORY: Menarche age 44, first live birth age 1, she is [place] P1. Menopause 2009. She never took hormone replacement.  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 Her husband Clair Gulling is a Administrator, and she makes calls to arrange for his loads. She also accompanies him on some of his long rides, which means she is away from home are quite a bit. Their son Linda Boyer lives in [place] where he works as a Dealer. The patient has 1 grandchild, currently 9 years old. She is not a Ambulance person.  ADVANCED DIRECTIVES: Not in place  HEALTH MAINTENANCE: Social History   Tobacco Use  . Smoking status: Never Smoker  . Smokeless tobacco: Never Used  Substance Use Topics  . Alcohol use: No    Alcohol/week: 0.0 oz  . Drug use: No     Colonoscopy: 2013  PAP: 2012  Bone density: 208/13/2014 / normal ([facility])  Lipid panel:  No Known Allergies  Current Outpatient Medications  Medication Sig Dispense Refill  . anastrozole (ARIMIDEX) 1 MG tablet Take 1 tablet (1 mg total) daily by mouth. 90 tablet 12  . BD PEN NEEDLE NANO U/F 32G X 4 MM MISC   1  . fish oil-omega-3 fatty acids 1000 MG capsule Take 1 g by mouth 2 (two) times daily.     Marland Kitchen FREESTYLE LITE test strip 3 (three) times daily. for testing  1  . glipiZIDE (GLUCOTROL) 10 MG tablet Take 20 mg by mouth 2 (two) times daily before a meal.    . ketoconazole (NIZORAL) 2 % cream Apply 1 application  daily topically. 15 g 0  . LANTUS SOLOSTAR 100 UNIT/ML Solostar Pen Inject 50 Units into the skin 2 (two) times daily.     Marland Kitchen LORazepam (ATIVAN) 0.5 MG tablet Take 0.5 mg by mouth as needed for anxiety.     Marland Kitchen losartan (COZAAR) 100 MG tablet Take 100 mg by mouth daily.    . metFORMIN (GLUCOPHAGE) 500 MG tablet Take 500 mg by mouth 2 (two) times daily with a meal.     . Multiple Vitamin (MULTIVITAMIN WITH MINERALS) TABS Take 1 tablet by mouth daily.    . rosuvastatin (CRESTOR) 5 MG tablet Take 5 mg by mouth daily.    . vitamin E 1000 UNIT capsule Take 1,000 Units by mouth daily.     No current facility-administered medications for this visit.       Objective: Morbidly obese white woman who appears stated age  67:  03/23/18 1231  BP: (!) 165/68  Pulse: 93  Resp: 18  Temp: 98.3 F (36.8 C)  SpO2: 96%     Body mass index is 43.53 kg/m.    ECOG FS: 1 Filed Weights   03/23/18 1231  Weight: 261 lb 9.6 oz (118.7 kg)   Sclerae unicteric, EOMs intact Oropharynx clear and moist No cervical or supraclavicular adenopathy Lungs no rales or rhonchi Heart regular rate and rhythm Abd soft, nontender, positive bowel sounds MSK no focal spinal tenderness, no upper extremity lymphedema Neuro: nonfocal, well oriented, appropriate affect Breasts: The right breast is status post lumpectomy and radiation, with no evidence of local recurrence.  The left breast is benign.  Both axillae are benign.  LAB RESULTS: Lab Results  Component Value Date   WBC 5.3 03/23/2018   NEUTROABS 3.5 03/23/2018   HGB 13.6 03/23/2018   HCT 39.6 03/23/2018   MCV 91.7 03/23/2018   PLT 157 03/23/2018      Chemistry      Component Value Date/Time   NA 139 08/18/2017 0951   K 4.7 08/18/2017 0951   CL 93 (L) 12/03/2012 1404   CL 96 (L) 11/17/2012 1223   CO2 27 08/18/2017 0951   BUN 10.5 08/18/2017 0951   CREATININE 0.8 08/18/2017 0951      Component Value Date/Time   CALCIUM 10.2 08/18/2017 0951    ALKPHOS 57 08/18/2017 0951   AST 48 (H) 08/18/2017 0951   ALT 36 08/18/2017 0951   BILITOT 0.69 08/18/2017 0951       STUDIES: Mm Diag Breast Tomo Bilateral  Result Date: 03/23/2018 CLINICAL DATA:  67 year old female presenting for routine annual surveillance status post right breast lumpectomy in 2014. EXAM: DIGITAL DIAGNOSTIC BILATERAL MAMMOGRAM WITH CAD AND TOMO COMPARISON:  Previous exam(s). ACR Breast Density Category c: The breast tissue is heterogeneously dense, which may obscure small masses. FINDINGS: Right breast lumpectomy site is stable. No suspicious calcifications, masses or areas of distortion are seen in the bilateral breasts. Mammographic images were processed with CAD. IMPRESSION: Stable right breast lumpectomy site. No mammographic evidence of malignancy in the bilateral breasts. RECOMMENDATION: 1.  Screening mammogram in one year.(Code:[id number]-B-01Y) 2. Consider MRI for additional annual screening given the patient's breast tissue density and personal history of breast cancer. According to 2018 ACR recommendations, MRI is recommended in women with personal histories of breast cancer and dense breast tissue, or those diagnosed before age 29. I have discussed the findings and recommendations with the patient. Results were also provided in writing at the conclusion of the visit. If applicable, a reminder letter will be sent to the patient regarding the next appointment. BI-RADS CATEGORY  2: Benign. Electronically Signed   By: Ammie Ferrier M.D.   On: 03/23/2018 11:27    ASSESSMENT: 67 y.o. [place], [place] woman   (1)  status post right breast lumpectomy and sentinel lymph node sampling 12/10/2012 for a pT1b pN0, stage IA invasive ductal carcinoma, grade 1, estrogen receptor 86% and progesterone receptor 100% positive, with an MIB-1 of 16% and no HER-2 amplification   (2) Oncotype DX score of 17 predicts a distant recurrence rate of 11% within 10 years if the patient's only systemic  treatment is tamoxifen for 5 years.  (3)  status post radiation therapy, completed 03/11/2013  (4)  started on anastrozole 03/13/2013;   (a) bone density 05/25/2013 was normal  (b) repeat bone density 08/09/2015 again normal  (5) intermittent hypercalcemia; PTH normal, likely cause his hydrochlorothiazide  PLAN:  Linda Boyer is now a little over 5 years out from definitive surgery for her breast cancer with no evidence of disease recurrence.  This is very favorable.  She has been on anastrozole for 5 years.  The benefit of continuing anastrozole for an additional 2 years in her case is very close to 0, certainly in the 1% or less range in terms of preventing distant recurrence.  Also we have no data for continuing anastrozole beyond 5 years in terms of preventing a new cancer.  Accordingly I am comfortable with her going off the anastrozole at this point.  We discussed discontinuing follow-up, but she remains very anxious about the possibility of cancer in fact she is interested in having multiple scans and breast MRI as well.  She discussed the MRI issue with Dr. Ammie Ferrier who told her that a recent [place] of radiology standard suggests that women with dense breasts and a history of cancer should have breast MRI in addition to yearly mammography.  Dr. Theda Sers did discuss issues regarding false positives with Eliese and I did as well.    Dreyah Montrose, Virgie Dad, MD  03/23/18 12:53 PM Medical Oncology and Hematology [facility] [address] [place], [place] [address] Tel. [phone]    Fax. [phone]  I, Margit Banda am acting as a scribe for Chauncey Cruel, MD.   I, Lurline Del MD, have reviewed the above documentation for accuracy and completeness, and I agree with the above.

## 2018-03-23 ENCOUNTER — Telehealth: Payer: Self-pay | Admitting: Oncology

## 2018-03-23 ENCOUNTER — Other Ambulatory Visit: Payer: Self-pay | Admitting: Oncology

## 2018-03-23 ENCOUNTER — Ambulatory Visit
Admission: RE | Admit: 2018-03-23 | Discharge: 2018-03-23 | Disposition: A | Discharge status: Home / Self Care | Payer: Medicare Other | Source: Ambulatory Visit | Attending: Oncology | Admitting: Oncology

## 2018-03-23 ENCOUNTER — Inpatient Hospital Stay: Payer: Medicare Other

## 2018-03-23 ENCOUNTER — Inpatient Hospital Stay: Payer: Medicare Other | Attending: Oncology | Admitting: Oncology

## 2018-03-23 ENCOUNTER — Other Ambulatory Visit: Payer: Self-pay | Admitting: *Deleted

## 2018-03-23 VITALS — BP 165/68 | HR 93 | Temp 98.3°F | Resp 18 | Ht 65.0 in | Wt 261.6 lb

## 2018-03-23 DIAGNOSIS — E119 Type 2 diabetes mellitus without complications: Secondary | ICD-10-CM | POA: Diagnosis not present

## 2018-03-23 DIAGNOSIS — Z9223 Personal history of estrogen therapy: Secondary | ICD-10-CM | POA: Diagnosis not present

## 2018-03-23 DIAGNOSIS — Z170 Estrogen receptor positive status [ER+]: Secondary | ICD-10-CM | POA: Diagnosis not present

## 2018-03-23 DIAGNOSIS — I10 Essential (primary) hypertension: Secondary | ICD-10-CM | POA: Diagnosis not present

## 2018-03-23 DIAGNOSIS — Z853 Personal history of malignant neoplasm of breast: Secondary | ICD-10-CM | POA: Diagnosis present

## 2018-03-23 DIAGNOSIS — Z801 Family history of malignant neoplasm of trachea, bronchus and lung: Secondary | ICD-10-CM | POA: Insufficient documentation

## 2018-03-23 DIAGNOSIS — G4730 Sleep apnea, unspecified: Secondary | ICD-10-CM | POA: Diagnosis not present

## 2018-03-23 DIAGNOSIS — C50411 Malignant neoplasm of upper-outer quadrant of right female breast: Secondary | ICD-10-CM

## 2018-03-23 DIAGNOSIS — Z923 Personal history of irradiation: Secondary | ICD-10-CM | POA: Diagnosis not present

## 2018-03-23 DIAGNOSIS — Z79899 Other long term (current) drug therapy: Secondary | ICD-10-CM

## 2018-03-23 DIAGNOSIS — Z794 Long term (current) use of insulin: Secondary | ICD-10-CM

## 2018-03-23 DIAGNOSIS — R922 Inconclusive mammogram: Secondary | ICD-10-CM

## 2018-03-23 HISTORY — DX: Personal history of irradiation: Z92.3

## 2018-03-23 LAB — CBC WITH DIFFERENTIAL (CANCER CENTER ONLY)
Basophils Absolute: 0 10*3/uL (ref 0.0–0.1)
Basophils Relative: 1 %
EOS ABS: 0.1 10*3/uL (ref 0.0–0.5)
EOS PCT: 2 %
HCT: 39.6 % (ref 34.8–46.6)
Hemoglobin: 13.6 g/dL (ref 11.6–15.9)
LYMPHS ABS: 1.3 10*3/uL (ref 0.9–3.3)
LYMPHS PCT: 24 %
MCH: 31.4 pg (ref 25.1–34.0)
MCHC: 34.2 g/dL (ref 31.5–36.0)
MCV: 91.7 fL (ref 79.5–101.0)
MONO ABS: 0.4 10*3/uL (ref 0.1–0.9)
Monocytes Relative: 8 %
Neutro Abs: 3.5 10*3/uL (ref 1.5–6.5)
Neutrophils Relative %: 65 %
PLATELETS: 157 10*3/uL (ref 145–400)
RBC: 4.32 MIL/uL (ref 3.70–5.45)
RDW: 13.5 % (ref 11.2–14.5)
WBC Count: 5.3 10*3/uL (ref 3.9–10.3)

## 2018-03-23 LAB — CMP (CANCER CENTER ONLY)
ALT: 23 U/L (ref 0–55)
ANION GAP: 10 (ref 3–11)
AST: 31 U/L (ref 5–34)
Albumin: 3.9 g/dL (ref 3.5–5.0)
Alkaline Phosphatase: 55 U/L (ref 40–150)
BUN: 13 mg/dL (ref 7–26)
CHLORIDE: 101 mmol/L (ref 98–109)
CO2: 26 mmol/L (ref 22–29)
CREATININE: 0.81 mg/dL (ref 0.60–1.10)
Calcium: 10.6 mg/dL — ABNORMAL HIGH (ref 8.4–10.4)
Glucose, Bld: 175 mg/dL — ABNORMAL HIGH (ref 70–140)
POTASSIUM: 4.5 mmol/L (ref 3.5–5.1)
Sodium: 137 mmol/L (ref 136–145)
Total Bilirubin: 0.6 mg/dL (ref 0.2–1.2)
Total Protein: 7 g/dL (ref 6.4–8.3)

## 2018-03-23 LAB — HEMOGLOBIN A1C
Hgb A1c MFr Bld: 7.8 % — ABNORMAL HIGH (ref 4.8–5.6)
Mean Plasma Glucose: 177.16 mg/dL

## 2018-03-23 MED ORDER — CAPSAICIN 0.033 % EX CREA: 1.0000 "application " | TOPICAL_CREAM | Freq: Two times a day (BID) | CUTANEOUS | 1 refills | Status: AC | PRN

## 2018-03-23 NOTE — Telephone Encounter (Signed)
Gave avs and calendar per patient requested a year appointment

## 2018-03-24 ENCOUNTER — Other Ambulatory Visit: Payer: Self-pay | Admitting: Oncology

## 2018-03-24 DIAGNOSIS — C50411 Malignant neoplasm of upper-outer quadrant of right female breast: Secondary | ICD-10-CM

## 2018-03-24 DIAGNOSIS — R922 Inconclusive mammogram: Secondary | ICD-10-CM

## 2018-03-24 DIAGNOSIS — Z1231 Encounter for screening mammogram for malignant neoplasm of breast: Secondary | ICD-10-CM

## 2018-03-24 DIAGNOSIS — Z170 Estrogen receptor positive status [ER+]: Principal | ICD-10-CM

## 2018-03-24 NOTE — Progress Notes (Signed)
I called the patient and let her know the results of her hemoglobin A1c.  Dr. Theda Sers also has done some research and the expectation is that there will be minimal to no charge to the patient out-of-pocket for this MRI which is indicated because the patient has breast cancer and dense breasts.  The patient will have the MRI on 09/16/2018 and see me later on that same day since [place] a hardship for her.

## 2018-04-09 ENCOUNTER — Other Ambulatory Visit: Payer: 59

## 2018-09-15 ENCOUNTER — Telehealth: Payer: Self-pay | Admitting: Oncology

## 2018-09-15 NOTE — Telephone Encounter (Signed)
R/s appt per 12/3 sch message - pt ios aware of new appt date and time

## 2018-09-16 ENCOUNTER — Inpatient Hospital Stay: Payer: Medicare Other

## 2018-09-16 ENCOUNTER — Inpatient Hospital Stay: Payer: Medicare Other | Admitting: Oncology

## 2018-10-14 ENCOUNTER — Other Ambulatory Visit: Payer: Medicare Other

## 2018-10-15 ENCOUNTER — Other Ambulatory Visit: Payer: Self-pay

## 2018-10-15 DIAGNOSIS — Z170 Estrogen receptor positive status [ER+]: Principal | ICD-10-CM

## 2018-10-15 DIAGNOSIS — C50411 Malignant neoplasm of upper-outer quadrant of right female breast: Secondary | ICD-10-CM

## 2018-10-17 NOTE — Progress Notes (Signed)
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 Coulibaly originally presented with discomfort in the lateral aspect of the left breast. She was referred for mammography to the breast Center 10/30/2011, and this showed dense breast tissue bilaterally but no suspicious finding in the left breast. There was no palpable abnormality. Ultrasound of the left breast was negative   On repeat diagnostic mammography a year later however, 11/01/2012, a spiculated mass was found in the upper right breast. There were a few associated punctate calcifications. An area of focal thickening was palpable and ultrasound confirmed a 1.2 cm irregular hypoechoic mass at the 12:00 position of the right breast 3 cm from the nipple. There was no abnormal adenopathy. The rest of the right breast and the left breast were unremarkable.  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 The MIB-1-1 was 16%. There was no HER-2 amplification.  The patient's subsequent history is as detailed below   INTERVAL HISTORY: Linda Boyer returns today for follow-up and treatment of of her estrogen receptor positive breast cancer.   The patient continues under observation.  Linda Boyer's last bone density screening [facility] showed a T-score of 0.4, which is considered normal. There is not a more recent bone density scan on file.   Since her last visit here on 03/23/2018, she underwent a digital diagnostic bilateral mammogram with tomography on the same day showing Breast Density Category C. Stable right breast lumpectomy site. No mammographic evidence of malignancy in the bilateral breasts.  She underwent a bilateral breast MRI with and without contrast on 10/18/2018. She expresses that she is worried about what the results of this test are and that she might have more cancer. Final results are not available at time of visit.  She is scheduled for a diagnostic bilateral mammogram with tomography on 03/28/2019. She will have a biopsy on a spot on her left ovary in February that has been watched by her OBGYN.    REVIEW OF SYSTEMS: Linda Boyer recently fell down an escalator; her blood sugar was low at time of incident. She fractured three ribs on her right side. She has not been able to ride in her husbands truck because it hurt her ribs to do so. The patient denies unusual headaches, visual changes, nausea, vomiting, or dizziness. There has been no unusual cough, phlegm production, or pleurisy. This been no change in bowel or bladder habits. The patient denies unexplained fatigue or unexplained weight loss, bleeding, rash, or fever. A detailed review of systems was otherwise noncontributory.    PAST MEDICAL HISTORY: Past Medical History:  Diagnosis Date  . Anxiety   . Breast cancer ([place])    right side, er 86% +, PR 100%  . Complex endometrial hyperplasia 2017   Untreated by patient choice.  . Diabetes mellitus without complication ([place])   . Hypertension    sees  Dr. Verdis Prime [phone]  . Personal history of radiation therapy   . Sleep apnea    "has not used cpap in a long time", sleep study over 5 years ago   hypertension  PAST SURGICAL HISTORY: Past Surgical History:  Procedure  Laterality Date  . BREAST LUMPECTOMY Right 2014  . BREAST LUMPECTOMY WITH NEEDLE LOCALIZATION AND AXILLARY SENTINEL LYMPH NODE BX Right 12/10/2012   Procedure: BREAST LUMPECTOMY WITH NEEDLE LOCALIZATION AND AXILLARY SENTINEL LYMPH NODE BX;  Surgeon: Harl Bowie, MD;  Location: [facility];  Service: General;  Laterality: Right;  . BREAST SURGERY  11/2012   Rt. lumpectomy--  . COLONOSCOPY    . DILATATION & CURETTAGE/HYSTEROSCOPY WITH MYOSURE N/A 11/13/2015   Procedure: DILATATION & CURETTAGE/HYSTEROSCOPY WITH MYOSURE;  Surgeon: Nunzio Cobbs, MD;  Location: [facility];  Service: Gynecology;  Laterality: N/A;    FAMILY HISTORY Family History  Problem Relation Age of Onset  . Heart attack Father 58       deceased  . Kidney failure Mother        dec 75  . Diabetes Mother   . Cancer Brother 5       Dec lung ca  . Cancer Brother 9       Dec lung ca  . Thyroid disease Sister   . Breast cancer Neg Hx    The patient's father died from a myocardial infarction at age 77. The patient's mother died from renal failure at age 40. The patient had 2 half brothers. Both died from lung cancer in the setting of tobacco abuse. The patient has 4 sisters. There is no history of breast or ovarian cancer in the family.   GYNECOLOGIC HISTORY: Menarche age 33, first live birth age 27, she is Linda Boyer. Menopause 2009. She never took hormone replacement.   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 Her husband Clair Gulling is a Administrator, and she makes calls to arrange for his loads. She also accompanies him on some of his long rides, which means she is away from home are quite a bit. Their son Corene Cornea lives in [place] where he works as a Dealer. The patient has 1 grandchild, currently 33 years old. She is not a Ambulance person.  ADVANCED DIRECTIVES: Not in place  HEALTH MAINTENANCE: Social History   Tobacco Use  . Smoking status:  Never Smoker  . Smokeless tobacco: Never Used  Substance Use Topics  . Alcohol use: No    Alcohol/week: 0.0 standard drinks  . Drug use: No    Colonoscopy: 2013  PAP: 2012  Bone density: 208/13/2014 / [facility])  Lipid panel:  No Known Allergies  Current Outpatient Medications  Medication Sig Dispense Refill  . anastrozole (ARIMIDEX) 1 MG tablet Take 1 tablet (1 mg total) daily by mouth. 90 tablet 12  . BD PEN NEEDLE NANO U/F 32G X 4 MM MISC   1  . Capsaicin (ZOSTRIX NATURAL PAIN RELIEF) 0.033 % CREA Apply 1 application topically 2 (two) times daily as needed. 56.6 g 1  . fish oil-omega-3 fatty acids 1000 MG capsule Take 1 g by mouth 2 (two) times daily.     Marland Kitchen FREESTYLE LITE test strip 3 (three) times daily. for testing  1  . glipiZIDE (GLUCOTROL) 10 MG tablet Take 20 mg by mouth 2 (two) times daily before a meal.    . ketoconazole (NIZORAL) 2 %  cream Apply 1 application daily topically. 15 g 0  . LANTUS SOLOSTAR 100 UNIT/ML Solostar Pen Inject 50 Units into the skin 2 (two) times daily.     Marland Kitchen LORazepam (ATIVAN) 0.5 MG tablet Take 0.5 mg by mouth as needed for anxiety.     Marland Kitchen losartan (COZAAR) 100 MG tablet Take 100 mg by mouth daily.    . metFORMIN (GLUCOPHAGE) 500 MG tablet Take 500 mg by mouth 2 (two) times daily with a meal.     . Multiple Vitamin (MULTIVITAMIN WITH MINERALS) TABS Take 1 tablet by mouth daily.    . rosuvastatin (CRESTOR) 5 MG tablet Take 5 mg by mouth daily.    . vitamin E 1000 UNIT capsule Take 1,000 Units by mouth daily.     No current facility-administered medications for this visit.       Objective: Morbidly obese white woman in no acute distress  Vitals:   10/18/18 1434  BP: (!) 155/70  Pulse: 92  Resp: 18  Temp: 98.4 F (36.9 C)  SpO2: 96%     Body mass index is 45.13 kg/m.    ECOG FS: 1 Filed Weights   10/18/18 1434  Weight: 271 lb 3.2 oz (123 kg)   Sclerae unicteric, pupils round and equal No cervical or supraclavicular  adenopathy Lungs no rales or rhonchi Heart regular rate and rhythm Abd soft, nontender, positive bowel sounds MSK mild right rib cage tenderness to palpation; no focal spinal tenderness, no upper extremity lymphedema Neuro: nonfocal, well oriented, appropriate affect Breasts: The right breast has undergone lumpectomy and radiation.  There is no evidence of local recurrence.  Left breast is benign.  Both axillae are benign.  LAB RESULTS: Lab Results  Component Value Date   WBC 4.8 10/18/2018   NEUTROABS 3.3 10/18/2018   HGB 13.3 10/18/2018   HCT 40.1 10/18/2018   MCV 97.1 10/18/2018   PLT 124 (L) 10/18/2018      Chemistry      Component Value Date/Time   NA 140 10/18/2018 1331   NA 139 08/18/2017 0951   K 4.5 10/18/2018 1331   K 4.7 08/18/2017 0951   CL 105 10/18/2018 1331   CL 96 (L) 11/17/2012 1223   CO2 26 10/18/2018 1331   CO2 27 08/18/2017 0951   BUN 11 10/18/2018 1331   BUN 10.5 08/18/2017 0951   CREATININE 0.87 10/18/2018 1331   CREATININE 0.8 08/18/2017 0951      Component Value Date/Time   CALCIUM 10.0 10/18/2018 1331   CALCIUM 10.2 08/18/2017 0951   ALKPHOS 71 10/18/2018 1331   ALKPHOS 57 08/18/2017 0951   AST 42 (H) 10/18/2018 1331   AST 48 (H) 08/18/2017 0951   ALT 34 10/18/2018 1331   ALT 36 08/18/2017 0951   BILITOT 0.8 10/18/2018 1331   BILITOT 0.69 08/18/2017 0951       STUDIES: MRI images discussed with the patient and reviewed with her  ASSESSMENT: 68 y.o. [place], [place] woman   (1)  status post right breast lumpectomy and sentinel lymph node sampling 12/10/2012 for a pT1b pN0, stage IA invasive ductal carcinoma, grade 1, estrogen receptor 86% and progesterone receptor 100% positive, with an MIB-1 of 16% and no HER-2 amplification   (2) Oncotype DX score of 17 predicts a distant recurrence rate of 11% within 10 years if the patient's only systemic treatment is tamoxifen for 5 years.  (3)  status post radiation therapy, completed  03/11/2013 Site/Dose: Right breast, 5040 cGy 28  sessions (no boost)   (4)  started on anastrozole 03/13/2013;   (a) bone density 05/25/2013 was normal  (b) repeat bone density 08/09/2015 again normal  (5) intermittent hypercalcemia; PTH normal, likely cause his hydrochlorothiazide   PLAN:  Dorinne is now nearly 6 years out from definitive surgery for her breast cancer with no evidence of disease recurrence.  This is very favorable.  She was considering going back on anastrozole because of the issue with the ovarian irregularity.  However this is going to be biopsied next month.  Certainly it would be suitable to await the biopsy results before starting back on a medication that she did not want to be on previously.  We reviewed the images of her MRI but unfortunately I cannot give her a definitive reading yet.  She will call tomorrow morning for that  I have scheduled her for mammography late June and to see me the same day.  He prefers for [place] to see her on an every 46-monthbasis pretty much indefinitely.   She knows to call for any other problem that may develop before the next visit.   , GVirgie Dad MD  10/18/18 3:28 PM Medical Oncology and H[facility]AG[address] [place] [address]Tel. [phone]   Fax. [phone]  I, AJacqualyn Poseyam acting as a sEducation administratorfor GChauncey Cruel MD.   I, GLurline DelMD, have reviewed the above documentation for accuracy and completeness, and I agree with the above.

## 2018-10-18 ENCOUNTER — Telehealth: Payer: Self-pay | Admitting: *Deleted

## 2018-10-18 ENCOUNTER — Telehealth: Payer: Self-pay | Admitting: Oncology

## 2018-10-18 ENCOUNTER — Inpatient Hospital Stay: Payer: Medicare Other | Attending: Oncology

## 2018-10-18 ENCOUNTER — Inpatient Hospital Stay (HOSPITAL_BASED_OUTPATIENT_CLINIC_OR_DEPARTMENT_OTHER): Payer: Medicare Other | Admitting: Oncology

## 2018-10-18 ENCOUNTER — Ambulatory Visit
Admission: RE | Admit: 2018-10-18 | Discharge: 2018-10-18 | Disposition: A | Discharge status: Home / Self Care | Payer: Medicare Other | Source: Ambulatory Visit | Attending: Oncology | Admitting: Oncology

## 2018-10-18 VITALS — BP 155/70 | HR 92 | Temp 98.4°F | Resp 18 | Wt 271.2 lb

## 2018-10-18 DIAGNOSIS — Z923 Personal history of irradiation: Secondary | ICD-10-CM

## 2018-10-18 DIAGNOSIS — Z794 Long term (current) use of insulin: Secondary | ICD-10-CM | POA: Diagnosis not present

## 2018-10-18 DIAGNOSIS — I10 Essential (primary) hypertension: Secondary | ICD-10-CM

## 2018-10-18 DIAGNOSIS — E119 Type 2 diabetes mellitus without complications: Secondary | ICD-10-CM | POA: Diagnosis not present

## 2018-10-18 DIAGNOSIS — R922 Inconclusive mammogram: Secondary | ICD-10-CM

## 2018-10-18 DIAGNOSIS — Z79899 Other long term (current) drug therapy: Secondary | ICD-10-CM | POA: Diagnosis not present

## 2018-10-18 DIAGNOSIS — Z853 Personal history of malignant neoplasm of breast: Secondary | ICD-10-CM | POA: Insufficient documentation

## 2018-10-18 DIAGNOSIS — Z170 Estrogen receptor positive status [ER+]: Secondary | ICD-10-CM | POA: Diagnosis not present

## 2018-10-18 DIAGNOSIS — C50411 Malignant neoplasm of upper-outer quadrant of right female breast: Secondary | ICD-10-CM

## 2018-10-18 LAB — CMP (CANCER CENTER ONLY)
ALBUMIN: 3.7 g/dL (ref 3.5–5.0)
ALK PHOS: 71 U/L (ref 38–126)
ALT: 34 U/L (ref 0–44)
AST: 42 U/L — ABNORMAL HIGH (ref 15–41)
Anion gap: 9 (ref 5–15)
BILIRUBIN TOTAL: 0.8 mg/dL (ref 0.3–1.2)
BUN: 11 mg/dL (ref 8–23)
CALCIUM: 10 mg/dL (ref 8.9–10.3)
CO2: 26 mmol/L (ref 22–32)
Chloride: 105 mmol/L (ref 98–111)
Creatinine: 0.87 mg/dL (ref 0.44–1.00)
GFR, Est AFR Am: >60 mL/min (ref >60)
GFR, Estimated: >60 mL/min (ref >60)
GLUCOSE: 178 mg/dL — AB (ref 70–99)
Potassium: 4.5 mmol/L (ref 3.5–5.1)
Sodium: 140 mmol/L (ref 135–145)
TOTAL PROTEIN: 6.9 g/dL (ref 6.5–8.1)

## 2018-10-18 LAB — CBC WITH DIFFERENTIAL (CANCER CENTER ONLY)
ABS IMMATURE GRANULOCYTES: 0.02 10*3/uL (ref 0.00–0.07)
BASOS PCT: 0 %
Basophils Absolute: 0 10*3/uL (ref 0.0–0.1)
Eosinophils Absolute: 0.1 10*3/uL (ref 0.0–0.5)
Eosinophils Relative: 2 %
HCT: 40.1 % (ref 36.0–46.0)
HEMOGLOBIN: 13.3 g/dL (ref 12.0–15.0)
IMMATURE GRANULOCYTES: 0 %
LYMPHS PCT: 18 %
Lymphs Abs: 0.9 10*3/uL (ref 0.7–4.0)
MCH: 32.2 pg (ref 26.0–34.0)
MCHC: 33.2 g/dL (ref 30.0–36.0)
MCV: 97.1 fL (ref 80.0–100.0)
MONO ABS: 0.5 10*3/uL (ref 0.1–1.0)
MONOS PCT: 10 %
NEUTROS ABS: 3.3 10*3/uL (ref 1.7–7.7)
Neutrophils Relative %: 70 %
PLATELETS: 124 10*3/uL — AB (ref 150–400)
RBC: 4.13 MIL/uL (ref 3.87–5.11)
RDW: 13.6 % (ref 11.5–15.5)
WBC Count: 4.8 10*3/uL (ref 4.0–10.5)
nRBC: 0 % (ref 0.0–0.2)

## 2018-10-18 MED ORDER — GADOBUTROL 1 MMOL/ML IV SOLN
10.0000 mL | Freq: Once | INTRAVENOUS | Status: AC | PRN
  Administered 2018-10-18: 10 mL via INTRAVENOUS

## 2018-10-18 NOTE — Telephone Encounter (Signed)
Gave avs and calendar ° °

## 2018-10-25 ENCOUNTER — Encounter: Payer: Self-pay | Admitting: Oncology

## 2018-11-11 ENCOUNTER — Other Ambulatory Visit: Payer: Self-pay | Admitting: Oncology

## 2018-11-11 NOTE — Telephone Encounter (Signed)
Next scheduled F/U 03-28-2019.

## 2019-03-16 ENCOUNTER — Other Ambulatory Visit: Payer: Self-pay | Admitting: Oncology

## 2019-03-24 ENCOUNTER — Other Ambulatory Visit: Payer: Self-pay

## 2019-03-24 DIAGNOSIS — C50411 Malignant neoplasm of upper-outer quadrant of right female breast: Secondary | ICD-10-CM

## 2019-03-24 DIAGNOSIS — Z170 Estrogen receptor positive status [ER+]: Secondary | ICD-10-CM

## 2019-03-27 NOTE — Progress Notes (Signed)
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 originally presented with discomfort in the lateral aspect of the left breast. She was referred for mammography to the breast Center 10/30/2011, and this showed dense breast tissue bilaterally but no suspicious finding in the left breast. There was no palpable abnormality. Ultrasound of the left breast was negative   On repeat diagnostic mammography a year later however, 11/01/2012, a spiculated mass was found in the upper right breast. There were a few associated punctate calcifications. An area of focal thickening was palpable and ultrasound confirmed a 1.2 cm irregular hypoechoic mass at the 12:00 position of the right breast 3 cm from the nipple. There was no abnormal adenopathy. The rest of the right breast and the left breast were unremarkable.  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 The MIB-1-1 was 16%. There was no HER-2 amplification.  The patient's subsequent history is as detailed below   INTERVAL HISTORY: Linda Boyer was seen today for follow-up and treatment of her estrogen receptor positive breast cancer.   She continues under  observation.   Since her last visit here, she underwent a bilateral breast MRI with and without contrast on 10/18/2018 showing: Breast Density Category C. There is no MRI evidence for malignancy in either breast.   Her Mammography was completed today, but her results were not back at the time of visit.   REVIEW OF SYSTEMS: Linda Boyer states that her blood pressure was odd today. She notes that she has gained 10 lbs since her last visit. She has been unable to exercise to pack and knee pain. She notes that her appetite has been decreased, but that her husband has also been gaining weight. She has been staying home as much as possible due to COVID-19; she does her own shopping, but she tries to get in and out as quickly as possible. The patient denies unusual headaches, visual changes, nausea, vomiting, or dizziness. There has been no unusual cough, phlegm production, or pleurisy. This been no change in bowel or bladder habits. The patient denies unexplained fatigue or unexplained weight loss, bleeding, rash, or fever. A detailed review of systems was otherwise noncontributory.   Wt Readings from Last 3 Encounters:  03/28/19 277 lb 11.2 oz (126 kg)  10/18/18 271 lb 3.2 oz (123 kg)  03/23/18 261 lb 9.6 oz (118.7 kg)    PAST MEDICAL HISTORY: Past Medical History:  Diagnosis Date  . Anxiety   . Breast cancer ([place])    right side, er 86% +, PR 100%  . Complex endometrial hyperplasia 2017   Untreated by patient choice.  . Diabetes mellitus without complication ([place])   . Hypertension    sees Dr. Verdis Prime [phone]  . Personal history of radiation therapy   . Sleep apnea    "has not used  cpap in a long time", sleep study over 5 years ago   hypertension  PAST SURGICAL HISTORY: Past Surgical History:  Procedure Laterality Date  . BREAST LUMPECTOMY Right 2014  . BREAST LUMPECTOMY WITH NEEDLE LOCALIZATION AND AXILLARY SENTINEL LYMPH NODE BX Right 12/10/2012   Procedure: BREAST LUMPECTOMY  WITH NEEDLE LOCALIZATION AND AXILLARY SENTINEL LYMPH NODE BX;  Surgeon: Harl Bowie, MD;  Location: [facility];  Service: General;  Laterality: Right;  . BREAST SURGERY  11/2012   Rt. lumpectomy--  . COLONOSCOPY    . DILATATION & CURETTAGE/HYSTEROSCOPY WITH MYOSURE N/A 11/13/2015   Procedure: DILATATION & CURETTAGE/HYSTEROSCOPY WITH MYOSURE;  Surgeon: Nunzio Cobbs, MD;  Location: [facility];  Service: Gynecology;  Laterality: N/A;    FAMILY HISTORY Family History  Problem Relation Age of Onset  . Heart attack Father 73       deceased  . Kidney failure Mother        dec 75  . Diabetes Mother   . Cancer Brother 71       Dec lung ca  . Cancer Brother 4       Dec lung ca  . Thyroid disease Sister   . Breast cancer Neg Hx    The patient's father died from a myocardial infarction at age 86. The patient's mother died from renal failure at age 25. The patient had 2 half brothers. Both died from lung cancer in the setting of tobacco abuse. The patient has 4 sisters. There is no history of breast or ovarian cancer in the family.   GYNECOLOGIC HISTORY: Menarche age 58, first live birth age 47, she is Sunflower P1. Menopause 2009. She never took hormone replacement.   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 Her husband Clair Gulling is a Administrator, and she makes calls to arrange for his loads. She also accompanies him on some of his long rides, which means she is away from home are quite a bit. Their son Corene Cornea lives in [place] where he works as a Dealer. The patient has 1 grandchild, currently 92 years old. She is not a Ambulance person.  ADVANCED DIRECTIVES: Not in place  HEALTH MAINTENANCE: Social History   Tobacco Use  . Smoking status: Never Smoker  . Smokeless tobacco: Never Used  Substance Use Topics  . Alcohol use: No    Alcohol/week: 0.0 standard drinks  . Drug use: No    Colonoscopy: 2013  PAP: 2012   Bone density: 208/13/2014 / normal ([facility])  Lipid panel:  No Known Allergies  Current Outpatient Medications  Medication Sig Dispense Refill  . anastrozole (ARIMIDEX) 1 MG tablet Take 1 tablet by mouth once daily 90 tablet 0  . BD PEN NEEDLE NANO U/F 32G X 4 MM MISC   1  . Capsaicin (ZOSTRIX NATURAL PAIN RELIEF) 0.033 % CREA Apply 1 application topically 2 (two) times daily as needed. 56.6 g 1  . fish oil-omega-3 fatty acids 1000 MG capsule Take 1 g by mouth 2 (two) times daily.     Marland Kitchen FREESTYLE LITE test strip 3 (three) times daily. for testing  1  . glipiZIDE (GLUCOTROL) 10 MG tablet Take 20 mg by mouth 2 (two) times daily before a meal.    . ketoconazole (NIZORAL) 2 % cream Apply 1 application daily topically. 15 g 0  . LANTUS SOLOSTAR 100 UNIT/ML Solostar Pen Inject 50 Units into the skin  2 (two) times daily.     Marland Kitchen LORazepam (ATIVAN) 0.5 MG tablet Take 0.5 mg by mouth as needed for anxiety.     Marland Kitchen losartan (COZAAR) 100 MG tablet Take 100 mg by mouth daily.    . metFORMIN (GLUCOPHAGE) 500 MG tablet Take 500 mg by mouth 2 (two) times daily with a meal.     . Multiple Vitamin (MULTIVITAMIN WITH MINERALS) TABS Take 1 tablet by mouth daily.    . rosuvastatin (CRESTOR) 5 MG tablet Take 5 mg by mouth daily.    . vitamin E 1000 UNIT capsule Take 1,000 Units by mouth daily.     No current facility-administered medications for this visit.       Objective: Morbidly obese white woman who appears stated age  45:   03/28/19 1400  BP: (!) 148/48  Pulse: 92  Resp: 18  Temp: 98.2 F (36.8 C)  SpO2: 98%  Repeat blood pressure 148/62   Body mass index is 46.21 kg/m.    ECOG FS: 1 Filed Weights   03/28/19 1400  Weight: 277 lb 11.2 oz (126 kg)   Sclerae unicteric, EOMs intact No cervical or supraclavicular adenopathy Lungs no rales or rhonchi Heart regular rate and rhythm Abd soft, obese, nontender, positive bowel sounds MSK no focal spinal tenderness Neuro: nonfocal, well  oriented, anxious affect Breasts: Status post lumpectomy and radiation on the right, with no evidence of local recurrence, but significant induration; left breast unremarkable.  Both axillae are benign   LAB RESULTS: Lab Results  Component Value Date   WBC 5.1 03/28/2019   NEUTROABS 3.3 03/28/2019   HGB 13.8 03/28/2019   HCT 40.4 03/28/2019   MCV 96.0 03/28/2019   PLT 118 (L) 03/28/2019      Chemistry      Component Value Date/Time   NA 140 10/18/2018 1331   NA 139 08/18/2017 0951   K 4.5 10/18/2018 1331   K 4.7 08/18/2017 0951   CL 105 10/18/2018 1331   CL 96 (L) 11/17/2012 1223   CO2 26 10/18/2018 1331   CO2 27 08/18/2017 0951   BUN 11 10/18/2018 1331   BUN 10.5 08/18/2017 0951   CREATININE 0.87 10/18/2018 1331   CREATININE 0.8 08/18/2017 0951      Component Value Date/Time   CALCIUM 10.0 10/18/2018 1331   CALCIUM 10.2 08/18/2017 0951   ALKPHOS 71 10/18/2018 1331   ALKPHOS 57 08/18/2017 0951   AST 42 (H) 10/18/2018 1331   AST 48 (H) 08/18/2017 0951   ALT 34 10/18/2018 1331   ALT 36 08/18/2017 0951   BILITOT 0.8 10/18/2018 1331   BILITOT 0.69 08/18/2017 0951       STUDIES: No results found.   ASSESSMENT: 68 y.o. [place], [place] woman   (1)  status post right breast lumpectomy and sentinel lymph node sampling 12/10/2012 for a pT1b pN0, stage IA invasive ductal carcinoma, grade 1, estrogen receptor 86% and progesterone receptor 100% positive, with an MIB-1 of 16% and no HER-2 amplification   (2) Oncotype DX score of 17 predicts a distant recurrence rate of 11% within 10 years if the patient's only systemic treatment is tamoxifen for 5 years.  (3)  status post radiation therapy, completed 03/11/2013 Site/Dose: Right breast, 5040 cGy 28 sessions (no boost)   (4)  started on anastrozole 03/13/2013;   (a) bone density 05/25/2013 was normal  (b) repeat bone density 08/09/2015 again normal  (5) intermittent hypercalcemia; PTH normal, likely cause his  hydrochlorothiazide  PLAN:  Denora is now a little over 6 years out from definitive surgery for her breast cancer with no evidence of disease recurrence.  This is very favorable.  She continues to be very anxious that is agreeable to once a year visits at this point which I think is appropriate.  She is concerned about her weight.  She is right to be so since morbid obesity is 1 of the highest risk factors for mortality if she became infected which hopefully she will not with the new coronavirus.  We discussed diet issues.  She will not be able to exercise because of her low back pain and bad knee problems  She will see me again in 1 year.  We will try to arrange for her mammogram the same day.  She knows to call for any other issues that may develop before that visit.  Athalene Kolle, Virgie Dad, MD  03/28/19 2:23 PM Medical Oncology and Hematology [facility] [address] [place], [place] [address] Tel. [phone]    Fax. [phone]  I, Linda Boyer am acting as a Education administrator for Chauncey Cruel, MD.   I, Linda Del MD, have reviewed the above documentation for accuracy and completeness, and I agree with the above.

## 2019-03-28 ENCOUNTER — Inpatient Hospital Stay: Payer: Medicare Other

## 2019-03-28 ENCOUNTER — Other Ambulatory Visit: Payer: Self-pay

## 2019-03-28 ENCOUNTER — Ambulatory Visit
Admission: RE | Admit: 2019-03-28 | Discharge: 2019-03-28 | Disposition: A | Discharge status: Home / Self Care | Payer: Medicare Other | Source: Ambulatory Visit | Attending: Oncology | Admitting: Oncology

## 2019-03-28 ENCOUNTER — Inpatient Hospital Stay: Payer: Medicare Other | Attending: Oncology | Admitting: Oncology

## 2019-03-28 VITALS — BP 148/48 | HR 92 | Temp 98.2°F | Resp 18 | Wt 277.7 lb

## 2019-03-28 DIAGNOSIS — F419 Anxiety disorder, unspecified: Secondary | ICD-10-CM | POA: Insufficient documentation

## 2019-03-28 DIAGNOSIS — Z79811 Long term (current) use of aromatase inhibitors: Secondary | ICD-10-CM | POA: Diagnosis not present

## 2019-03-28 DIAGNOSIS — Z7984 Long term (current) use of oral hypoglycemic drugs: Secondary | ICD-10-CM | POA: Insufficient documentation

## 2019-03-28 DIAGNOSIS — E119 Type 2 diabetes mellitus without complications: Secondary | ICD-10-CM | POA: Diagnosis not present

## 2019-03-28 DIAGNOSIS — Z170 Estrogen receptor positive status [ER+]: Secondary | ICD-10-CM

## 2019-03-28 DIAGNOSIS — I10 Essential (primary) hypertension: Secondary | ICD-10-CM | POA: Insufficient documentation

## 2019-03-28 DIAGNOSIS — C50411 Malignant neoplasm of upper-outer quadrant of right female breast: Secondary | ICD-10-CM | POA: Insufficient documentation

## 2019-03-28 DIAGNOSIS — Z6841 Body Mass Index (BMI) 40.0 and over, adult: Secondary | ICD-10-CM

## 2019-03-28 DIAGNOSIS — Z923 Personal history of irradiation: Secondary | ICD-10-CM | POA: Diagnosis not present

## 2019-03-28 DIAGNOSIS — Z794 Long term (current) use of insulin: Secondary | ICD-10-CM

## 2019-03-28 DIAGNOSIS — Z79899 Other long term (current) drug therapy: Secondary | ICD-10-CM | POA: Diagnosis not present

## 2019-03-28 DIAGNOSIS — Z803 Family history of malignant neoplasm of breast: Secondary | ICD-10-CM

## 2019-03-28 DIAGNOSIS — Z1231 Encounter for screening mammogram for malignant neoplasm of breast: Secondary | ICD-10-CM

## 2019-03-28 LAB — CMP (CANCER CENTER ONLY)
ALT: 30 U/L (ref 0–44)
AST: 34 U/L (ref 15–41)
Albumin: 3.9 g/dL (ref 3.5–5.0)
Alkaline Phosphatase: 58 U/L (ref 38–126)
Anion gap: 13 (ref 5–15)
BUN: 7 mg/dL — ABNORMAL LOW (ref 8–23)
CO2: 24 mmol/L (ref 22–32)
Calcium: 9.6 mg/dL (ref 8.9–10.3)
Chloride: 103 mmol/L (ref 98–111)
Creatinine: 0.78 mg/dL (ref 0.44–1.00)
GFR, Est AFR Am: >60 mL/min (ref >60)
GFR, Estimated: >60 mL/min (ref >60)
Glucose, Bld: 166 mg/dL — ABNORMAL HIGH (ref 70–99)
Potassium: 4.3 mmol/L (ref 3.5–5.1)
Sodium: 140 mmol/L (ref 135–145)
Total Bilirubin: 0.6 mg/dL (ref 0.3–1.2)
Total Protein: 7 g/dL (ref 6.5–8.1)

## 2019-03-28 LAB — CBC WITH DIFFERENTIAL (CANCER CENTER ONLY)
Abs Immature Granulocytes: 0.01 10*3/uL (ref 0.00–0.07)
Basophils Absolute: 0 10*3/uL (ref 0.0–0.1)
Basophils Relative: 1 %
Eosinophils Absolute: 0.2 10*3/uL (ref 0.0–0.5)
Eosinophils Relative: 3 %
HCT: 40.4 % (ref 36.0–46.0)
Hemoglobin: 13.8 g/dL (ref 12.0–15.0)
Immature Granulocytes: 0 %
Lymphocytes Relative: 22 %
Lymphs Abs: 1.1 10*3/uL (ref 0.7–4.0)
MCH: 32.8 pg (ref 26.0–34.0)
MCHC: 34.2 g/dL (ref 30.0–36.0)
MCV: 96 fL (ref 80.0–100.0)
Monocytes Absolute: 0.5 10*3/uL (ref 0.1–1.0)
Monocytes Relative: 10 %
Neutro Abs: 3.3 10*3/uL (ref 1.7–7.7)
Neutrophils Relative %: 64 %
Platelet Count: 118 10*3/uL — ABNORMAL LOW (ref 150–400)
RBC: 4.21 MIL/uL (ref 3.87–5.11)
RDW: 12.9 % (ref 11.5–15.5)
WBC Count: 5.1 10*3/uL (ref 4.0–10.5)
nRBC: 0 % (ref 0.0–0.2)

## 2019-03-29 ENCOUNTER — Telehealth: Payer: Self-pay | Admitting: Oncology

## 2019-03-29 NOTE — Telephone Encounter (Signed)
I talk with patient regarding schedule  

## 2019-03-31 ENCOUNTER — Telehealth: Payer: Self-pay

## 2019-03-31 NOTE — Telephone Encounter (Signed)
Per pt's request her labs have been faxed to her PCP, Dr Freda Munro at [facility] in [place] Fax #:  [phone] A copy of labs has also been mailed to pt.

## 2019-04-11 ENCOUNTER — Ambulatory Visit: Payer: Medicare Other | Admitting: Oncology

## 2019-04-11 ENCOUNTER — Other Ambulatory Visit: Payer: Medicare Other

## 2019-07-04 ENCOUNTER — Other Ambulatory Visit: Payer: Self-pay | Admitting: Oncology

## 2019-11-02 NOTE — Telephone Encounter (Signed)
No entry 

## 2019-12-09 ENCOUNTER — Other Ambulatory Visit: Payer: Self-pay | Admitting: Oncology

## 2019-12-09 DIAGNOSIS — Z1231 Encounter for screening mammogram for malignant neoplasm of breast: Secondary | ICD-10-CM

## 2020-03-27 ENCOUNTER — Ambulatory Visit: Payer: Medicare Other | Admitting: Oncology

## 2020-03-27 ENCOUNTER — Other Ambulatory Visit: Payer: Medicare Other

## 2020-03-30 ENCOUNTER — Ambulatory Visit: Payer: Medicare Other

## 2020-04-01 NOTE — Progress Notes (Signed)
No show

## 2020-04-02 ENCOUNTER — Ambulatory Visit: Payer: Medicare Other

## 2020-04-02 ENCOUNTER — Inpatient Hospital Stay (HOSPITAL_BASED_OUTPATIENT_CLINIC_OR_DEPARTMENT_OTHER): Payer: Medicare Other | Admitting: Oncology

## 2020-04-02 ENCOUNTER — Telehealth: Payer: Self-pay | Admitting: Oncology

## 2020-04-02 ENCOUNTER — Encounter: Payer: Self-pay | Admitting: Oncology

## 2020-04-02 ENCOUNTER — Inpatient Hospital Stay: Payer: Medicare Other | Attending: Internal Medicine

## 2020-04-02 DIAGNOSIS — C50411 Malignant neoplasm of upper-outer quadrant of right female breast: Secondary | ICD-10-CM

## 2020-04-02 DIAGNOSIS — Z170 Estrogen receptor positive status [ER+]: Secondary | ICD-10-CM

## 2020-04-02 NOTE — Telephone Encounter (Signed)
Called pt per 6/21 sch message - no anwer and no vmail set up .

## 2020-04-23 ENCOUNTER — Telehealth: Payer: Self-pay | Admitting: Oncology

## 2020-04-23 NOTE — Telephone Encounter (Signed)
Called pt twice per 7/9 schedule message.  - once on 7/9 and a second time on 7/12 - no answer and no vmail.

## 2020-04-24 ENCOUNTER — Inpatient Hospital Stay: Payer: Medicare Other | Attending: Internal Medicine

## 2020-04-24 ENCOUNTER — Ambulatory Visit
Admission: RE | Admit: 2020-04-24 | Discharge: 2020-04-24 | Disposition: A | Discharge status: Home / Self Care | Payer: Medicare Other | Source: Ambulatory Visit | Attending: Oncology | Admitting: Oncology

## 2020-04-24 ENCOUNTER — Other Ambulatory Visit: Payer: Self-pay

## 2020-04-24 ENCOUNTER — Other Ambulatory Visit: Payer: Self-pay | Admitting: *Deleted

## 2020-04-24 ENCOUNTER — Telehealth: Payer: Self-pay | Admitting: Oncology

## 2020-04-24 DIAGNOSIS — Z170 Estrogen receptor positive status [ER+]: Secondary | ICD-10-CM | POA: Insufficient documentation

## 2020-04-24 DIAGNOSIS — C50411 Malignant neoplasm of upper-outer quadrant of right female breast: Secondary | ICD-10-CM

## 2020-04-24 DIAGNOSIS — Z79899 Other long term (current) drug therapy: Secondary | ICD-10-CM | POA: Diagnosis not present

## 2020-04-24 DIAGNOSIS — E119 Type 2 diabetes mellitus without complications: Secondary | ICD-10-CM

## 2020-04-24 DIAGNOSIS — Z1231 Encounter for screening mammogram for malignant neoplasm of breast: Secondary | ICD-10-CM

## 2020-04-24 DIAGNOSIS — Z853 Personal history of malignant neoplasm of breast: Secondary | ICD-10-CM | POA: Diagnosis present

## 2020-04-24 LAB — CBC WITH DIFFERENTIAL (CANCER CENTER ONLY)
Abs Immature Granulocytes: 0.02 10*3/uL (ref 0.00–0.07)
Basophils Absolute: 0 10*3/uL (ref 0.0–0.1)
Basophils Relative: 1 %
Eosinophils Absolute: 0.2 10*3/uL (ref 0.0–0.5)
Eosinophils Relative: 3 %
HCT: 41.5 % (ref 36.0–46.0)
Hemoglobin: 14.7 g/dL (ref 12.0–15.0)
Immature Granulocytes: 0 %
Lymphocytes Relative: 20 %
Lymphs Abs: 1.3 10*3/uL (ref 0.7–4.0)
MCH: 32.6 pg (ref 26.0–34.0)
MCHC: 35.4 g/dL (ref 30.0–36.0)
MCV: 92 fL (ref 80.0–100.0)
Monocytes Absolute: 0.6 10*3/uL (ref 0.1–1.0)
Monocytes Relative: 9 %
Neutro Abs: 4.5 10*3/uL (ref 1.7–7.7)
Neutrophils Relative %: 67 %
Platelet Count: 192 10*3/uL (ref 150–400)
RBC: 4.51 MIL/uL (ref 3.87–5.11)
RDW: 12.9 % (ref 11.5–15.5)
WBC Count: 6.6 10*3/uL (ref 4.0–10.5)
nRBC: 0 % (ref 0.0–0.2)

## 2020-04-24 LAB — HEMOGLOBIN A1C
Hgb A1c MFr Bld: 6 % — ABNORMAL HIGH (ref 4.8–5.6)
Mean Plasma Glucose: 125.5 mg/dL

## 2020-04-24 NOTE — Telephone Encounter (Signed)
Called pt again per 7/12 sch msg - no answer and vmail not set up .,

## 2020-04-26 ENCOUNTER — Telehealth: Payer: Self-pay

## 2020-04-26 NOTE — Telephone Encounter (Signed)
Pt called and requests copy of CBC, A1C and mammogram results mailed to her home. OK per Dr Jana Hakim to fulfill this request. Pt verbalized thanks and understanding.
